# Patient Record
Sex: Male | Born: 1940 | Race: White | Hispanic: No | Smoking: Former smoker
Health system: Southern US, Community
[De-identification: ages and names within clinical notes are randomized; demographics above are authoritative.]

## PROBLEM LIST (undated history)

## (undated) DIAGNOSIS — C61 Malignant neoplasm of prostate: Secondary | ICD-10-CM

## (undated) DIAGNOSIS — T753XXA Motion sickness, initial encounter: Secondary | ICD-10-CM

## (undated) DIAGNOSIS — M1990 Unspecified osteoarthritis, unspecified site: Secondary | ICD-10-CM

## (undated) DIAGNOSIS — R972 Elevated prostate specific antigen [PSA]: Secondary | ICD-10-CM

## (undated) DIAGNOSIS — F419 Anxiety disorder, unspecified: Secondary | ICD-10-CM

## (undated) DIAGNOSIS — K219 Gastro-esophageal reflux disease without esophagitis: Secondary | ICD-10-CM

## (undated) DIAGNOSIS — C801 Malignant (primary) neoplasm, unspecified: Secondary | ICD-10-CM

## (undated) DIAGNOSIS — I10 Essential (primary) hypertension: Secondary | ICD-10-CM

## (undated) DIAGNOSIS — N419 Inflammatory disease of prostate, unspecified: Secondary | ICD-10-CM

## (undated) DIAGNOSIS — N400 Enlarged prostate without lower urinary tract symptoms: Secondary | ICD-10-CM

## (undated) DIAGNOSIS — M109 Gout, unspecified: Secondary | ICD-10-CM

## (undated) DIAGNOSIS — R351 Nocturia: Secondary | ICD-10-CM

## (undated) HISTORY — PX: APPENDECTOMY: SHX54

## (undated) HISTORY — DX: Nocturia: R35.1

## (undated) HISTORY — DX: Unspecified osteoarthritis, unspecified site: M19.90

## (undated) HISTORY — DX: Benign prostatic hyperplasia without lower urinary tract symptoms: N40.0

## (undated) HISTORY — DX: Elevated prostate specific antigen (PSA): R97.20

## (undated) HISTORY — PX: TONSILLECTOMY: SUR1361

## (undated) HISTORY — PX: SKIN CANCER EXCISION: SHX779

## (undated) HISTORY — DX: Inflammatory disease of prostate, unspecified: N41.9

## (undated) HISTORY — DX: Gout, unspecified: M10.9

---

## 2008-02-11 ENCOUNTER — Ambulatory Visit: Payer: Self-pay | Admitting: Gastroenterology

## 2012-05-20 LAB — PSA: PSA: 2.4

## 2014-05-04 DIAGNOSIS — L820 Inflamed seborrheic keratosis: Secondary | ICD-10-CM | POA: Diagnosis not present

## 2014-05-04 DIAGNOSIS — Z789 Other specified health status: Secondary | ICD-10-CM | POA: Diagnosis not present

## 2014-05-04 DIAGNOSIS — L821 Other seborrheic keratosis: Secondary | ICD-10-CM | POA: Diagnosis not present

## 2014-06-22 DIAGNOSIS — D485 Neoplasm of uncertain behavior of skin: Secondary | ICD-10-CM | POA: Diagnosis not present

## 2014-06-22 DIAGNOSIS — C44529 Squamous cell carcinoma of skin of other part of trunk: Secondary | ICD-10-CM | POA: Diagnosis not present

## 2014-07-15 DIAGNOSIS — C44529 Squamous cell carcinoma of skin of other part of trunk: Secondary | ICD-10-CM | POA: Diagnosis not present

## 2015-01-04 ENCOUNTER — Ambulatory Visit: Payer: Self-pay | Admitting: Urology

## 2015-01-04 ENCOUNTER — Encounter: Payer: Self-pay | Admitting: Obstetrics and Gynecology

## 2015-01-04 ENCOUNTER — Ambulatory Visit (INDEPENDENT_AMBULATORY_CARE_PROVIDER_SITE_OTHER): Payer: Medicare Other | Admitting: Obstetrics and Gynecology

## 2015-01-04 VITALS — BP 163/64 | HR 67 | Ht 70.0 in | Wt 192.0 lb

## 2015-01-04 DIAGNOSIS — N400 Enlarged prostate without lower urinary tract symptoms: Secondary | ICD-10-CM | POA: Diagnosis not present

## 2015-01-04 DIAGNOSIS — R972 Elevated prostate specific antigen [PSA]: Secondary | ICD-10-CM

## 2015-01-04 DIAGNOSIS — Z125 Encounter for screening for malignant neoplasm of prostate: Secondary | ICD-10-CM

## 2015-01-04 LAB — BLADDER SCAN AMB NON-IMAGING: SCAN RESULT: 0

## 2015-01-04 NOTE — Progress Notes (Signed)
01/04/2015 9:31 AM   [name] 01/26/1940 [id number]  Referring provider: No referring provider defined for this encounter.  Chief Complaint  Patient presents with  . Benign Prostatic Hypertrophy    1 year recheck  . Urinary Frequency    HPI: Patient is a 74 year old male with a history of BPH with LUTS and rising PSA presenting today for annual follow-up. His IPSS score today is 22 and quality of life is mostly dissatisfied. He reports symptoms of urinary frequency, and tendency, weak stream and occasional sensation of incomplete bladder emptying and having to strain to urinate. Nocturia approximately 3 times per night. Patient has tried Flomax in the past but discontinued due to symptoms of dizziness. He reports that his symptoms are unchanged since his last visit. He denies any flank pain, fevers or gross hematuria. No bone pain or unexplained weight loss.  PSA History: 12/31/13 PSA 3.4   12/13/11 PSA 2.3  PMH: Past Medical History  Diagnosis Date  . Prostatitis   . BPH (benign prostatic hyperplasia)   . Nocturia   . Arthritis   . Gout     Surgical History: Past Surgical History  Procedure Laterality Date  . Tonsillectomy    . Appendectomy      Home Medications:    Medication List       This list is accurate as of: 01/04/15  9:31 AM.  Always use your most recent med list.               aspirin 81 MG tablet  Take 81 mg by mouth daily.     CINNAMON PO  Take by mouth daily.     FISH OIL PO  Take by mouth daily.     GARLIC PO  Take by mouth daily.     multivitamin tablet  Take 1 tablet by mouth daily.        Allergies:  Allergies  Allergen Reactions  . Flomax [Tamsulosin Hcl] Other (See Comments)    Dizziness    Family History: Family History  Problem Relation Age of Onset  . Heart disease Father   . Stroke Mother   . Prostate cancer Neg Hx   . Kidney disease Father     one kidney removed    Social History:  reports that he  quit smoking about 43 years ago. He does not have any smokeless tobacco history on file. He reports that he drinks alcohol. He reports that he does not use illicit drugs.  ROS: UROLOGY Frequent Urination?: Yes Hard to postpone urination?: No Burning/pain with urination?: No Get up at night to urinate?: Yes Leakage of urine?: No Urine stream starts and stops?: Yes Trouble starting stream?: No Do you have to strain to urinate?: No Blood in urine?: No Urinary tract infection?: No Sexually transmitted disease?: No Injury to kidneys or bladder?: No Painful intercourse?: No Weak stream?: Yes Erection problems?: No Penile pain?: No  Gastrointestinal Nausea?: No Vomiting?: No Indigestion/heartburn?: Yes Diarrhea?: No Constipation?: No  Constitutional Fever: No Night sweats?: No Weight loss?: No Fatigue?: No  Skin Skin rash/lesions?: No Itching?: No  Eyes Blurred vision?: No Double vision?: No  Ears/Nose/Throat Sore throat?: No Sinus problems?: No  Hematologic/Lymphatic Swollen glands?: No Easy bruising?: No  Cardiovascular Leg swelling?: No Chest pain?: No  Respiratory Cough?: No Shortness of breath?: No  Endocrine Excessive thirst?: No  Musculoskeletal Back pain?: Yes Joint pain?: Yes  Neurological Headaches?: No Dizziness?: No  Psychologic Depression?: No Anxiety?: No  Physical  Exam: BP 163/64 mmHg  Pulse 67  Ht 5\' 10"  (1.778 m)  Wt 192 lb (87.091 kg)  BMI 27.55 kg/m2  Constitutional:  Alert and oriented, No acute distress. HEENT: [place] AT, moist mucus membranes.  Trachea midline, no masses. Cardiovascular: No clubbing, cyanosis, or edema. Respiratory: Normal respiratory effort, no increased work of breathing. GU: No CVA tenderness.  DRE: +2 smooth, no nodules Skin: No rashes, bruises or suspicious lesions. Neurologic: Grossly intact, no focal deficits, moving all 4 extremities. Psychiatric: Normal mood and affect.  Laboratory  Data:   Urinalysis Results for orders placed or performed in visit on 01/04/15  BLADDER SCAN AMB NON-IMAGING  Result Value Ref Range   Scan Result 0     Pertinent Imaging:   Assessment & Plan:   1. BPH (benign prostatic hyperplasia) with LUTS-   I-PSS:22,  QOL: 4.  Patient is reluctant to try any additional medications for his urinary tract symptoms due to previous intolerance of Flomax. His PVR is 0 today. I provided him with 1 week samples of Rapaflo to try and instructed him to take it before bed.   Will call for prescription if tolerated well. - PSA - BLADDER SCAN AMB NON-IMAGING  2. Rising PSA- PSA checked today. 12/31/13 PSA 3.4   12/13/11 PSA 2.3  3. Prostate Cancer Screening discussion- Recommendations: I discussed with the patient the implications of an elevated PSA. Specifically, I explained to the patient that in a man over the age of 41 with a mildly elevated PSA that his risk for advanced or aggressive prostate cancer was fairly low. Further, the risk of the patient dying from low-grade prostate cancer at this point in his life is also very low. We typically stop screening between the ages of 41 and 43 for this reason. At his age, the treatment for prostate cancer is palliative most often and is not initiated until the patient becomes symptomatic including worsening voiding symptoms or bone pain. Patient is a very healthy and active 74 year old male at this point we will plan to continue prostate cancer screening until at least age 69. Patient questions were answered and he is in agreement with plan. Plan: Check digital rectal exam annually and PSA  Follow-up when necessary  AUA Guideline's (2013): The panel does not recommend routine PSA screening in men age 79+ years or any man with less than a 10 to 30 year life expectancy.  If the individual is in excellent health and after discussion it is decided to do a screening PSA, the threshold for biopsy should be raised to 10  ng/mL and if the PSA returns below 3 ng/mL, discontinue screening.  Return in about 1 year (around 01/04/2016) for DRE/PSA.  These notes generated with voice recognition software. I apologize for typographical errors.  Herbert Moors, [facility] [address] [place], [place] [address] [phone]

## 2015-01-05 LAB — PSA: PROSTATE SPECIFIC AG, SERUM: 6.2 ng/mL — AB (ref 0.0–4.0)

## 2015-01-06 ENCOUNTER — Other Ambulatory Visit: Payer: Medicare Other

## 2015-01-06 ENCOUNTER — Other Ambulatory Visit: Payer: Self-pay | Admitting: Obstetrics and Gynecology

## 2015-01-06 ENCOUNTER — Telehealth: Payer: Self-pay

## 2015-01-06 DIAGNOSIS — N400 Enlarged prostate without lower urinary tract symptoms: Secondary | ICD-10-CM

## 2015-01-06 DIAGNOSIS — R972 Elevated prostate specific antigen [PSA]: Secondary | ICD-10-CM

## 2015-01-06 NOTE — Telephone Encounter (Signed)
-----   Message from Roda Shutters, [place] sent at 01/06/2015  8:40 AM EST ----- Patient notified of his PSA level. It has nearly doubled since last year. He is aware that they is very concerning for prostate cancer. He will return to have it rechecked for confirmation either today or tomorrow and I  recommended he prepare himself for a prostate biopsy. He understands that this is the only way to see if his PSA elevation is due to prostate cancer. I reviewed the risks of the prostate biopsy and explained the procedure to him. We'll you please mail him the prostate biopsy preparation paperwork as well as post procedure information.  He is taking 89 mg of aspirin daily which he will need to stop prior to his biopsy. I did not discuss this with him while I was speaking with him on the phone today. We need to inquire whether or not we need to have a primary care or etiology clearance for him to stop the aspirin prior to biopsy.  Please schedule a biopsy and notify the patient that should his rechecked PSA be inconsistent with his last we will cancel the biopsy. Otherwise we should proceed as planned. Thanks

## 2015-01-06 NOTE — Telephone Encounter (Signed)
Spoke with pt in reference to PSA results. Pt stated he started taking ASA on his own free will. Made pt aware will need to come off of ASA prior to prostate bx. Pt voiced understanding. Pt stated that he would like to wait until he gets PSA results before making appt.

## 2015-01-07 LAB — PSA: Prostate Specific Ag, Serum: 5.4 ng/mL — ABNORMAL HIGH (ref 0.0–4.0)

## 2015-01-13 ENCOUNTER — Telehealth: Payer: Self-pay | Admitting: Obstetrics and Gynecology

## 2015-01-13 NOTE — Telephone Encounter (Signed)

## 2015-01-25 ENCOUNTER — Other Ambulatory Visit: Payer: Medicare Other

## 2015-02-02 ENCOUNTER — Other Ambulatory Visit: Payer: Self-pay | Admitting: Urology

## 2015-02-02 ENCOUNTER — Ambulatory Visit (INDEPENDENT_AMBULATORY_CARE_PROVIDER_SITE_OTHER): Payer: Medicare Other | Admitting: Urology

## 2015-02-02 ENCOUNTER — Encounter: Payer: Self-pay | Admitting: Urology

## 2015-02-02 VITALS — BP 176/95 | HR 62 | Ht 70.0 in | Wt 193.0 lb

## 2015-02-02 DIAGNOSIS — R972 Elevated prostate specific antigen [PSA]: Secondary | ICD-10-CM | POA: Diagnosis not present

## 2015-02-02 DIAGNOSIS — C61 Malignant neoplasm of prostate: Secondary | ICD-10-CM | POA: Diagnosis not present

## 2015-02-02 HISTORY — DX: Elevated prostate specific antigen (PSA): R97.20

## 2015-02-02 MED ORDER — GENTAMICIN SULFATE 40 MG/ML IJ SOLN
80.0000 mg | Freq: Once | INTRAMUSCULAR | Status: AC
  Administered 2015-02-02: 80 mg via INTRAMUSCULAR

## 2015-02-02 MED ORDER — LEVOFLOXACIN 500 MG PO TABS
500.0000 mg | ORAL_TABLET | Freq: Once | ORAL | Status: AC
  Administered 2015-02-02: 500 mg via ORAL

## 2015-02-02 NOTE — Progress Notes (Signed)
Prostate Biopsy Procedure   Informed consent was obtained after discussing risks/benefits of the procedure.  A time out was performed to ensure correct patient identity.  Pre-Procedure: - Last PSA Level:  Lab Results  Component Value Date   PSA 2.4 05/20/2012   - Gentamicin given prophylactically - Levaquin 500 mg administered PO -Transrectal Ultrasound performed revealing a 55 gm prostate -No significant hypoechoic or median lobe noted  Procedure: - Prostate block performed using 10 cc 1% lidocaine and biopsies taken from sextant areas, a total of 12 under ultrasound guidance.  Post-Procedure: - Patient tolerated the procedure well - He was counseled to seek immediate medical attention if experiences any severe pain, significant bleeding, or fevers - Return in one week to discuss biopsy results

## 2015-02-06 LAB — PATHOLOGY REPORT

## 2015-02-09 ENCOUNTER — Ambulatory Visit (INDEPENDENT_AMBULATORY_CARE_PROVIDER_SITE_OTHER): Payer: Medicare Other | Admitting: Urology

## 2015-02-09 ENCOUNTER — Encounter: Payer: Self-pay | Admitting: Urology

## 2015-02-09 ENCOUNTER — Ambulatory Visit: Payer: Medicare Other

## 2015-02-09 VITALS — BP 147/77 | HR 62 | Ht 70.0 in | Wt 193.7 lb

## 2015-02-09 DIAGNOSIS — C61 Malignant neoplasm of prostate: Secondary | ICD-10-CM

## 2015-02-09 NOTE — Patient Instructions (Signed)
PSA in 5 months Prostate MRI in 5 months Repeat prostate biospy in 6 months

## 2015-02-09 NOTE — Progress Notes (Signed)
Tony Mendez is a 75 year old man who presents today for discussion of his new diagnosis of prostate cancer.  The patient was diagnosed with prostate cancer after a routine PSA screening on an annual exam revealed a PSA of 6.2. Repeat PSA returned 5.4. The patient does not have family history of prostate cancer. His rectal exam was normal.  I PSS: 20, Q O L3 Patient is able to obtain an erection, although currently is not sexually active.  PSA: 5.4 Prostate volume: 55 g Pathology: 7/12 cores positive for Gleason 3+3= 6, the patient had 4 cores positive on the left (95% in 1 core), he had 3 cores positive on the right, all were low volume.  Past Medical History  Diagnosis Date  . Prostatitis   . BPH (benign prostatic hyperplasia)   . Nocturia   . Arthritis   . Gout   . Elevated PSA 02/02/2015   Current Outpatient Prescriptions on File Prior to Visit  Medication Sig Dispense Refill  . aspirin 81 MG tablet Take 81 mg by mouth daily. Reported on 02/02/2015    . CINNAMON PO Take by mouth daily.    Marland Kitchen GARLIC PO Take by mouth daily.    . Multiple Vitamin (MULTIVITAMIN) tablet Take 1 tablet by mouth daily.    . Omega-3 Fatty Acids (FISH OIL PO) Take by mouth daily.     No current facility-administered medications on file prior to visit.    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 I explained to him the Gleason score in detail. I then explained to him the risk stratification, and informed him that he was considered low risk. We then went through the treatment options for which I explained to him active surveillance, robotic-assisted prostatectomy, and radiation therapy. After spending 30 minutes discussing the pros and cons of each treatment modality the patient has opted to proceed with active  surveillance.  As part of active surveillance, I explained to the patient that I typically get a prostate MRI 4-5 months after the initial biopsy and perform a confirmation biopsy. At that point, I would also like to obtain a PSA. We'll get this set up for the patient and I'll plan to see him back following his MRI.

## 2015-02-10 ENCOUNTER — Encounter: Payer: Self-pay | Admitting: Urology

## 2015-02-13 ENCOUNTER — Emergency Department
Admission: EM | Admit: 2015-02-13 | Discharge: 2015-02-13 | Disposition: A | Discharge status: Home / Self Care | Payer: Medicare Other | Attending: Emergency Medicine | Admitting: Emergency Medicine

## 2015-02-13 ENCOUNTER — Encounter: Payer: Self-pay | Admitting: Emergency Medicine

## 2015-02-13 ENCOUNTER — Emergency Department: Payer: Medicare Other

## 2015-02-13 DIAGNOSIS — Z7982 Long term (current) use of aspirin: Secondary | ICD-10-CM | POA: Insufficient documentation

## 2015-02-13 DIAGNOSIS — Z87891 Personal history of nicotine dependence: Secondary | ICD-10-CM | POA: Insufficient documentation

## 2015-02-13 DIAGNOSIS — R079 Chest pain, unspecified: Secondary | ICD-10-CM | POA: Diagnosis not present

## 2015-02-13 DIAGNOSIS — R0602 Shortness of breath: Secondary | ICD-10-CM | POA: Diagnosis not present

## 2015-02-13 DIAGNOSIS — Z79899 Other long term (current) drug therapy: Secondary | ICD-10-CM | POA: Insufficient documentation

## 2015-02-13 DIAGNOSIS — R0789 Other chest pain: Secondary | ICD-10-CM | POA: Diagnosis not present

## 2015-02-13 HISTORY — DX: Malignant neoplasm of prostate: C61

## 2015-02-13 HISTORY — DX: Malignant (primary) neoplasm, unspecified: C80.1

## 2015-02-13 LAB — COMPREHENSIVE METABOLIC PANEL
ALK PHOS: 58 U/L (ref 38–126)
ALT: 22 U/L (ref 17–63)
ANION GAP: 9 (ref 5–15)
AST: 27 U/L (ref 15–41)
Albumin: 4.2 g/dL (ref 3.5–5.0)
BUN: 11 mg/dL (ref 6–20)
CALCIUM: 9.3 mg/dL (ref 8.9–10.3)
CHLORIDE: 107 mmol/L (ref 101–111)
CO2: 26 mmol/L (ref 22–32)
Creatinine, Ser: 0.92 mg/dL (ref 0.61–1.24)
Glucose, Bld: 120 mg/dL — ABNORMAL HIGH (ref 65–99)
Potassium: 4.4 mmol/L (ref 3.5–5.1)
SODIUM: 142 mmol/L (ref 135–145)
Total Bilirubin: 1.4 mg/dL — ABNORMAL HIGH (ref 0.3–1.2)
Total Protein: 7.4 g/dL (ref 6.5–8.1)

## 2015-02-13 LAB — CBC
HCT: 47.6 % (ref 40.0–52.0)
Hemoglobin: 16.4 g/dL (ref 13.0–18.0)
MCH: 32.4 pg (ref 26.0–34.0)
MCHC: 34.4 g/dL (ref 32.0–36.0)
MCV: 94.3 fL (ref 80.0–100.0)
PLATELETS: 277 10*3/uL (ref 150–440)
RBC: 5.05 MIL/uL (ref 4.40–5.90)
RDW: 12.6 % (ref 11.5–14.5)
WBC: 8.1 10*3/uL (ref 3.8–10.6)

## 2015-02-13 LAB — TROPONIN I
Troponin I: <0.03 ng/mL (ref <0.031)
Troponin I: <0.03 ng/mL (ref <0.031)

## 2015-02-13 NOTE — ED Notes (Signed)
Chest pressure began yesterday, states increased while walking yesterday. Took aspirin this am.

## 2015-02-13 NOTE — ED Provider Notes (Signed)
[facility] Emergency Department Provider Note  Time seen: 10:14 AM  I have reviewed the triage vital signs and the nursing notes.   HISTORY  Chief Complaint Chest Pain    HPI Tony Mendez is a 75 y.o. male with a past medical history of gout, arthritis, recently diagnosed prostate cancer, who presents to the emergency department with chest pressure sensation. According to the patient since yesterday he has been experiencing a pressure sensation to his chest. Denies any "pain." States occasionally he feels short of breath but denies nausea or diaphoresis. Denies any cardiac history personally. States the shortness of breath is somewhat worse with exertion but denies any increased chest pressure with exertion.     Past Medical History  Diagnosis Date  . Prostatitis   . BPH (benign prostatic hyperplasia)   . Nocturia   . Arthritis   . Gout   . Elevated PSA 02/02/2015  . Cancer ([place])   . Prostate cancer [facility])     Patient Active Problem List   Diagnosis Date Noted  . Elevated PSA 02/02/2015    Past Surgical History  Procedure Laterality Date  . Tonsillectomy    . Appendectomy    . Skin cancer excision      Current Outpatient Rx  Name  Route  Sig  Dispense  Refill  . aspirin 81 MG tablet   Oral   Take 81 mg by mouth daily. Reported on 02/02/2015         . CINNAMON PO   Oral   Take by mouth daily.         Marland Kitchen GARLIC PO   Oral   Take by mouth daily.         . Multiple Vitamin (MULTIVITAMIN) tablet   Oral   Take 1 tablet by mouth daily.         . Omega-3 Fatty Acids (FISH OIL PO)   Oral   Take by mouth daily.           Allergies Flomax  Family History  Problem Relation Age of Onset  . Heart disease Father   . Stroke Mother   . Prostate cancer Neg Hx   . Kidney disease Father     one kidney removed    Social History Social History  Substance Use Topics  . Smoking status: Former Smoker    Quit date: 12/21/1971   . Smokeless tobacco: None  . Alcohol Use: 0.0 oz/week    0 Standard drinks or equivalent per week    Review of Systems Constitutional: Negative for fever. Cardiovascular: Chest pressure. Respiratory: Intermittent shortness of breath Gastrointestinal: Negative for abdominal pain Musculoskeletal: Negative for back pain. Neurological: Negative for headache 10-point ROS otherwise negative.  ____________________________________________   PHYSICAL EXAM:  VITAL SIGNS: ED Triage Vitals  Enc Vitals Group     BP 02/13/15 0942 179/114 mmHg     Pulse Rate 02/13/15 0942 66     Resp 02/13/15 0942 15     Temp 02/13/15 0942 98.7 F (37.1 C)     Temp Source 02/13/15 0942 Oral     SpO2 02/13/15 0942 99 %     Weight 02/13/15 0942 190 lb (86.183 kg)     Height 02/13/15 0942 5\' 10"  (1.778 m)     Head Cir --      Peak Flow --      Pain Score 02/13/15 0934 3     Pain Loc --  Pain Edu? --      Excl. in [place]? --     Constitutional: Alert and oriented. Well appearing and in no distress. Eyes: Normal exam ENT   Head: Normocephalic and atraumatic.   Mouth/Throat: Mucous membranes are moist. Cardiovascular: Normal rate, regular rhythm. No murmur Respiratory: Normal respiratory effort without tachypnea nor retractions. Breath sounds are clear and equal bilaterally. No wheezes/rales/rhonchi. Gastrointestinal: Soft and nontender. No distention.   Musculoskeletal: Nontender with normal range of motion in all extremities. No lower extremity tenderness or edema. Neurologic:  Normal speech and language. No gross focal neurologic deficits Skin:  Skin is warm, dry and intact.  Psychiatric: Mood and affect are normal.  ____________________________________________    EKG  EKG reviewed and interpreted by myself shows normal sinus rhythm at 68 bpm, narrow QRS, normal axis and normal intervals, no ST changes, occasional PVC.  ____________________________________________     RADIOLOGY  Chest x-ray shows no acute abnormality  ____________________________________________    INITIAL IMPRESSION / ASSESSMENT AND PLAN / ED COURSE  Pertinent labs & imaging results that were available during my care of the patient were reviewed by me and considered in my medical decision making (see chart for details).  Patient presents with mild chest pressure since yesterday. States he has been under a lot of stress since recently being diagnosed with prostate cancer several days ago. Denies any radiation of pain. States occasional shortness of breath but denies nausea or diaphoresis. We will check labs, chest x-ray. EKG is reassuring.  Chest x-ray negative. Labs are within normal limits. Troponin negative 2. We will discharge the patient home with primary care follow-up. If sent his primary care physician a message through Epic and help so we can arrange for a stress test this week.  ____________________________________________   FINAL CLINICAL IMPRESSION(S) / ED DIAGNOSES  Chest pain   Harvest Dark, MD 02/13/15 1308

## 2015-02-13 NOTE — Discharge Instructions (Signed)
You have been seen in the emergency department today for chest pain. Your workup has shown normal results. As we discussed please follow-up with your primary care physician in the next 1-2 days for recheck and to arrange a stress test. Return to the emergency department for any further chest pain, trouble breathing, or any other symptom personally concerning to yourself.   Nonspecific Chest Pain It is often hard to find the cause of chest pain. There is always a chance that your pain could be related to something serious, such as a heart attack or a blood clot in your lungs. Chest pain can also be caused by conditions that are not life-threatening. If you have chest pain, it is very important to follow up with your doctor.  HOME CARE  If you were prescribed an antibiotic medicine, finish it all even if you start to feel better.  Avoid any activities that cause chest pain.  Do not use any tobacco products, including cigarettes, chewing tobacco, or electronic cigarettes. If you need help quitting, ask your doctor.  Do not drink alcohol.  Take medicines only as told by your doctor.  Keep all follow-up visits as told by your doctor. This is important. This includes any further testing if your chest pain does not go away.  Your doctor may tell you to keep your head raised (elevated) while you sleep.  Make lifestyle changes as told by your doctor. These may include:  Getting regular exercise. Ask your doctor to suggest some activities that are safe for you.  Eating a heart-healthy diet. Your doctor or a diet specialist (dietitian) can help you to learn healthy eating options.  Maintaining a healthy weight.  Managing diabetes, if necessary.  Reducing stress. GET HELP IF:  Your chest pain does not go away, even after treatment.  You have a rash with blisters on your chest.  You have a fever. GET HELP RIGHT AWAY IF:  Your chest pain is worse.  You have an increasing cough, or you  cough up blood.  You have severe belly (abdominal) pain.  You feel extremely weak.  You pass out (faint).  You have chills.  You have sudden, unexplained chest discomfort.  You have sudden, unexplained discomfort in your arms, back, neck, or jaw.  You have shortness of breath at any time.  You suddenly start to sweat, or your skin gets clammy.  You feel nauseous.  You vomit.  You suddenly feel light-headed or dizzy.  Your heart begins to beat quickly, or it feels like it is skipping beats. These symptoms may be an emergency. Do not wait to see if the symptoms will go away. Get medical help right away. Call your local emergency services (911 in the [place] Do not drive yourself to the hospital.   This information is not intended to replace advice given to you by your health care provider. Make sure you discuss any questions you have with your health care provider.   Document Released: 06/21/2007 Document Revised: 01/23/2014 Document Reviewed: 08/08/2013 Elsevier Interactive Patient Education [employer].

## 2015-02-16 DIAGNOSIS — R5381 Other malaise: Secondary | ICD-10-CM | POA: Diagnosis not present

## 2015-02-16 DIAGNOSIS — I10 Essential (primary) hypertension: Secondary | ICD-10-CM | POA: Diagnosis not present

## 2015-02-16 DIAGNOSIS — E784 Other hyperlipidemia: Secondary | ICD-10-CM | POA: Diagnosis not present

## 2015-02-23 DIAGNOSIS — Z0000 Encounter for general adult medical examination without abnormal findings: Secondary | ICD-10-CM | POA: Diagnosis not present

## 2015-02-25 DIAGNOSIS — R079 Chest pain, unspecified: Secondary | ICD-10-CM | POA: Diagnosis not present

## 2015-02-25 DIAGNOSIS — I10 Essential (primary) hypertension: Secondary | ICD-10-CM | POA: Diagnosis not present

## 2015-02-25 DIAGNOSIS — E785 Hyperlipidemia, unspecified: Secondary | ICD-10-CM | POA: Diagnosis not present

## 2015-03-11 DIAGNOSIS — R079 Chest pain, unspecified: Secondary | ICD-10-CM | POA: Diagnosis not present

## 2015-03-11 DIAGNOSIS — I10 Essential (primary) hypertension: Secondary | ICD-10-CM | POA: Diagnosis not present

## 2015-03-11 DIAGNOSIS — E785 Hyperlipidemia, unspecified: Secondary | ICD-10-CM | POA: Diagnosis not present

## 2015-04-08 ENCOUNTER — Ambulatory Visit (INDEPENDENT_AMBULATORY_CARE_PROVIDER_SITE_OTHER): Payer: Medicare Other | Admitting: Urology

## 2015-04-08 ENCOUNTER — Encounter: Payer: Self-pay | Admitting: Urology

## 2015-04-08 VITALS — BP 151/80 | HR 62 | Ht 71.0 in | Wt 193.0 lb

## 2015-04-08 DIAGNOSIS — C61 Malignant neoplasm of prostate: Secondary | ICD-10-CM | POA: Diagnosis not present

## 2015-04-08 DIAGNOSIS — N400 Enlarged prostate without lower urinary tract symptoms: Secondary | ICD-10-CM

## 2015-04-08 NOTE — Progress Notes (Signed)
04/08/2015 2:54 PM   [name] 12-07-1940 [id number]  Referring provider: Cletis Athens, MD [address] [place], [place] [address]  Chief Complaint  Patient presents with  . Prostate Cancer    second opinion    HPI: 75 year old male recently diagnosed with Gleason 3+3 prostate cancer status post prostate biopsy on 02/02/2015. Biopsy was performed for rising PSA as high 6.2/5.4 around the time of biopsy. He was noted to have a 55 g prostate. Pathology showed 712 cores positive for Gleason 3+3, 4 and the left involving up to 95% of the tissue, and 3 on the right which are low volume disease.  He does have baseline urinary symptoms.  Pre-diagnosis IPSS 20/3.  Nares symptoms primarily include urinary frequency, weak stream, occasional sensation of incomplete bladder emptying and nocturia 3. He is previously tried Flomax which caused dizziness.  No baseline erectile dysfunction however is not sexually active and not worried about the quality of his erections.  No weight loss or bone pain.  No family history of prostate cancer.  He was seen following the biopsy by my colleague Dr. Louis Meckel at which time he elected to undergo active surveillance with plans for prostatic MRI at 6 months after diagnosis, serial PSA/DRE. He returns today for a second opinion. He is somewhat anxious about not intervening on his prostate cancer and worried about his rising PSA. He requests a PSA today.   Component     Latest Ref Rng 05/20/2012 01/04/2015 01/06/2015  PSA     0.0 - 4.0 ng/mL 2.4 6.2 (H) 5.4 (H)     PMH: Past Medical History  Diagnosis Date  . Prostatitis   . BPH (benign prostatic hyperplasia)   . Nocturia   . Arthritis   . Gout   . Elevated PSA 02/02/2015  . Cancer ([place])   . Prostate cancer [facility])     Surgical History: Past Surgical History  Procedure Laterality Date  . Tonsillectomy    . Appendectomy    . Skin cancer excision      Home Medications:      Medication List       This list is accurate as of: 04/08/15  2:54 PM.  Always use your most recent med list.               aspirin 81 MG tablet  Take 81 mg by mouth daily. Reported on 02/02/2015     CINNAMON PO  Take by mouth daily.     FISH OIL PO  Take by mouth daily.     GARLIC PO  Take by mouth daily.     multivitamin tablet  Take 1 tablet by mouth daily.        Allergies:  Allergies  Allergen Reactions  . Flomax [Tamsulosin Hcl] Other (See Comments)    Dizziness    Family History: Family History  Problem Relation Age of Onset  . Heart disease Father   . Stroke Mother   . Prostate cancer Neg Hx   . Kidney disease Father     one kidney removed    Social History:  reports that he quit smoking about 43 years ago. He does not have any smokeless tobacco history on file. He reports that he drinks alcohol. He reports that he does not use illicit drugs.  ROS: UROLOGY Frequent Urination?: Yes Hard to postpone urination?: No Burning/pain with urination?: No Get up at night to urinate?: Yes Leakage of urine?: No Urine stream starts and stops?:  Yes Trouble starting stream?: No Do you have to strain to urinate?: No Blood in urine?: No Urinary tract infection?: No Sexually transmitted disease?: No Injury to kidneys or bladder?: No Painful intercourse?: No Weak stream?: No Erection problems?: No Penile pain?: No  Gastrointestinal Nausea?: No Vomiting?: No Indigestion/heartburn?: No Diarrhea?: No Constipation?: No  Constitutional Fever: No Night sweats?: No Weight loss?: No Fatigue?: No  Skin Skin rash/lesions?: No Itching?: No  Eyes Blurred vision?: No Double vision?: No  Ears/Nose/Throat Sore throat?: No Sinus problems?: No  Hematologic/Lymphatic Swollen glands?: No Easy bruising?: No  Cardiovascular Leg swelling?: No Chest pain?: No  Respiratory Shortness of breath?: No  Endocrine Excessive thirst?:  No  Musculoskeletal Back pain?: Yes Joint pain?: Yes  Neurological Headaches?: No Dizziness?: No  Psychologic Depression?: No Anxiety?: No  Physical Exam: BP 151/80 mmHg  Pulse 62  Ht 5\' 11"  (1.803 m)  Wt 193 lb (87.544 kg)  BMI 26.93 kg/m2  Constitutional:  Alert and oriented, No acute distress.  Slightly younger than stated age in appearance. HEENT: [place] AT, moist mucus membranes.  Trachea midline, no masses. Cardiovascular: No clubbing, cyanosis, or edema. Respiratory: Normal respiratory effort, no increased work of breathing. GI: Thin.  Skin: No rashes, bruises or suspicious lesions. Neurologic: Grossly intact, no focal deficits, moving all 4 extremities. Psychiatric: Normal mood and affect.  Laboratory Data: Lab Results  Component Value Date   WBC 8.1 02/13/2015   HGB 16.4 02/13/2015   HCT 47.6 02/13/2015   MCV 94.3 02/13/2015   PLT 277 02/13/2015    Lab Results  Component Value Date   CREATININE 0.92 02/13/2015    Lab Results  Component Value Date   PSA 2.4 05/20/2012    Pertinent Imaging: n/a  Assessment & Plan:    1. Prostate cancer [facility]) The patient was counseled about the natural history of prostate cancer and the standard treatment options that are available for prostate cancer. It was explained to him how his age and life expectancy, clinical stage, Gleason score, and PSA affect his prognosis, the decision to proceed with additional staging studies, as well as how that information influences recommended treatment strategies. We discussed the roles for active surveillance, radiation therapy including external beam radiation and brachytherapy, surgical therapy, androgen deprivation, as well as ablative therapy options for the treatment of prostate cancer as appropriate to his individual cancer situation. We discussed the risks and benefits of these options with regard to their impact on cancer control and also in terms of potential adverse events,  complications, and impact on quality of life particularly related to urinary, bowel, and sexual function. The patient was encouraged to ask questions throughout the discussion today and all questions were answered to his stated satisfaction. In addition, the patient was providedwith and/or directed to appropriate resources and literature for further education about prostate cancer treatment options.  Based on his overall risk stratification, he falls into the low risk category although does have a fairly significant volume of disease and a decent rise in his PSA over the past few years. Active surveillance is a reasonable option although we discussed today that often patient's crossover into other treatment arms for various reasons including patient anxiety, rising PSA, significant lesions, or repeat biopsy results with evidence of progressive disease.  He was offered a referral today to radiation oncology to further explore his options which he declines. Although he is 70, he is fairly healthy and has an excellent functional status and would offer him robotic prostatectomy  with the understanding that his risk of erectile dysfunction and incontinence are higher than a younger patient.  At this point, he will continue on his active surveillance protocol. He would like to have a repeat PSA in one month at which time we will call him with these results.  Plan for endorectal MRI in July as Artie planned. He will follow-up shortly thereafter.  - PSA; Future  2. BPH (benign prostatic hyperplasia) Baseline lower urinary urinary tract symptoms. Unable to tolerate Flomax. Not interested in any further medical management this time. We'll continue to follow.   Return for 1 month for PSA (lab only) then f/u after MRI July.  Hollice Espy, MD  [facility] [address] [place], [place] [address] [phone]  I spent 25 min with this patient of which greater than 50%  was spent in counseling and coordination of care with the patient.

## 2015-05-10 ENCOUNTER — Other Ambulatory Visit: Payer: Medicare Other

## 2015-05-10 ENCOUNTER — Other Ambulatory Visit: Payer: Self-pay

## 2015-05-10 DIAGNOSIS — C61 Malignant neoplasm of prostate: Secondary | ICD-10-CM

## 2015-05-11 LAB — PSA: Prostate Specific Ag, Serum: 5.1 ng/mL — ABNORMAL HIGH (ref 0.0–4.0)

## 2015-05-13 ENCOUNTER — Telehealth: Payer: Self-pay

## 2015-05-13 NOTE — Telephone Encounter (Signed)
-----   Message from Hollice Espy, MD sent at 05/11/2015  4:25 PM EDT ----- PSA stable, slightly lower than 4 months ago at 5.4  Plan for MRI as discussed.    Hollice Espy, MD

## 2015-05-13 NOTE — Telephone Encounter (Signed)
Spoke with pt in reference to PSA results and MRI. Pt voiced understanding.

## 2015-05-17 ENCOUNTER — Telehealth: Payer: Self-pay

## 2015-05-17 NOTE — Telephone Encounter (Signed)
-----   Message from Ardis Hughs, MD sent at 05/15/2015 11:46 AM EDT ----- Regarding: psa Please inform patient of his current PSA and that it's stable. ----- Message -----    From: SYSTEM    Sent: 05/15/2015  12:04 AM      To: Ardis Hughs, MD

## 2015-05-17 NOTE — Telephone Encounter (Signed)
LMOM-most recent labs are normal.  

## 2015-06-28 ENCOUNTER — Telehealth: Payer: Self-pay | Admitting: Urology

## 2015-06-28 NOTE — Telephone Encounter (Signed)
Patient called because he received a call to schedule his MRI but the patient did not want to schedule because he said he is claustrophobic and will not be able to stay in the machine for the 45 minutes it will take to perform to exam. He said the only way he would be able to do it would be if he was asleep or medicated? He wants to know if he even needs to really have the MRI and wants you to call him to discuss this. His biopsy is scheduled for 08-03-15 and he needs to MRI prior to this.  He said he doesn't even know if being medicated would help him? He is very claustrophobic and can't be in closed up spaces at all. Don't we have an open MRI somewhere? Please call him to discuss and let me know so that we can get this scheduled. They are booking out at least 2-3 weeks for MRI's.   Thanks,  Sharyn Lull

## 2015-07-01 NOTE — Telephone Encounter (Signed)
There is an open MRI in [place].  We can prescribe him Valium to help.

## 2015-07-02 ENCOUNTER — Telehealth: Payer: Self-pay | Admitting: Urology

## 2015-07-02 MED ORDER — DIAZEPAM 10 MG PO TABS: 10.0000 mg | ORAL_TABLET | Freq: Four times a day (QID) | ORAL | Status: DC | PRN

## 2015-07-02 NOTE — Telephone Encounter (Signed)
done

## 2015-07-02 NOTE — Telephone Encounter (Signed)
Patient called me last week and said that he was not able to have his MRI at [facility] because of his claustrophobia. i sent Dr. Louis Meckel a message and he text me back and asked if you would write him a script for Valium and he is going to have his MRI done in [place].  Can you do this for me please and I will have the patient come and pick it up.   thanks

## 2015-07-07 ENCOUNTER — Ambulatory Visit (HOSPITAL_COMMUNITY)
Admission: RE | Admit: 2015-07-07 | Discharge: 2015-07-07 | Disposition: A | Discharge status: Home / Self Care | Payer: Medicare Other | Source: Ambulatory Visit | Attending: Urology | Admitting: Urology

## 2015-07-07 DIAGNOSIS — C61 Malignant neoplasm of prostate: Secondary | ICD-10-CM | POA: Diagnosis not present

## 2015-07-07 LAB — CREATININE, SERUM
Creatinine, Ser: 0.96 mg/dL (ref 0.61–1.24)
GFR calc non Af Amer: >60 mL/min (ref >60)

## 2015-07-07 MED ORDER — GADOBENATE DIMEGLUMINE 529 MG/ML IV SOLN
20.0000 mL | Freq: Once | INTRAVENOUS | Status: AC
  Administered 2015-07-07: 18 mL via INTRAVENOUS

## 2015-07-26 ENCOUNTER — Other Ambulatory Visit: Payer: Self-pay

## 2015-07-26 DIAGNOSIS — Z8546 Personal history of malignant neoplasm of prostate: Secondary | ICD-10-CM

## 2015-07-27 ENCOUNTER — Telehealth: Payer: Self-pay | Admitting: Urology

## 2015-07-27 ENCOUNTER — Other Ambulatory Visit: Payer: Medicare Other

## 2015-07-27 DIAGNOSIS — Z8546 Personal history of malignant neoplasm of prostate: Secondary | ICD-10-CM | POA: Diagnosis not present

## 2015-07-27 NOTE — Telephone Encounter (Signed)
Patient was in the office today and requested a valium prior to his biopsy on 7/18.  He said that he is very anxious and nervous about the procedure and he had a valium with his recent MRI and it helped.  Please advise.

## 2015-07-28 LAB — PSA: Prostate Specific Ag, Serum: 5.2 ng/mL — ABNORMAL HIGH (ref 0.0–4.0)

## 2015-08-03 ENCOUNTER — Other Ambulatory Visit: Payer: Medicare Other

## 2015-08-06 ENCOUNTER — Encounter: Payer: Self-pay | Admitting: Urology

## 2015-08-06 ENCOUNTER — Ambulatory Visit (INDEPENDENT_AMBULATORY_CARE_PROVIDER_SITE_OTHER): Payer: Medicare Other | Admitting: Urology

## 2015-08-06 VITALS — BP 165/81 | HR 80 | Ht 70.0 in | Wt 193.0 lb

## 2015-08-06 DIAGNOSIS — C61 Malignant neoplasm of prostate: Secondary | ICD-10-CM | POA: Diagnosis not present

## 2015-08-06 DIAGNOSIS — N400 Enlarged prostate without lower urinary tract symptoms: Secondary | ICD-10-CM

## 2015-08-06 NOTE — Progress Notes (Signed)
8:52 AM  08/09/15   [name] [birth year] [id number]  Referring provider: Cletis Athens, MD [address] [place], [place] [address]  Chief Complaint  Patient presents with  . Prostate Cancer    MRI results    HPI: 75 year old male recently diagnosed with Gleason 3+3 prostate cancer status post prostate biopsy on 02/02/2015. Biopsy was performed for rising PSA as high 6.2/5.4 around the time of biopsy. He was noted to have a 55 g prostate. Pathology showed 7/12 cores positive for Gleason 3+3, 4 and the left involving up to 95% of the tissue, and 3 on the right which are low volume disease.  He returns today after undergoing a prostate MRI on 07/07/2015 for further surveillance.  This shows an area at the right base/mid gland which measures 1.7 cm which is concerning for high-grade carcinoma. There is no evidence of local invasion, metastatic disease, or pelvic adenopathy.  His PSA has remained relatively stable since his diagnosis.  He does have baseline urinary symptoms.  Pre-diagnosis IPSS 20/3.  Nares symptoms primarily include urinary frequency, weak stream, occasional sensation of incomplete bladder emptying and nocturia 3. He is previously tried Flomax which caused dizziness.  No baseline erectile dysfunction however is not sexually active and not worried about the quality of his erections.  No weight loss or bone pain.  No family history of prostate cancer.    Component     Latest Ref Rng 05/20/2012 01/04/2015 01/06/2015 05/10/2015 07/27/2015  PSA     0.0 - 4.0 ng/mL 2.4 6.2 (H) 5.4 (H) 5.1 (H) 5.2 (H)    PMH: Past Medical History:  Diagnosis Date  . Arthritis   . BPH (benign prostatic hyperplasia)   . Cancer ([place])   . Elevated PSA 02/02/2015  . Gout   . Nocturia   . Prostate cancer ([place])   . Prostatitis     Surgical History: Past Surgical History:  Procedure Laterality Date  . APPENDECTOMY    . SKIN CANCER EXCISION    . TONSILLECTOMY      Home  Medications:    Medication List       Accurate as of 08/06/15 11:59 PM. Always use your most recent med list.          aspirin 81 MG tablet Take 81 mg by mouth daily. Reported on 02/02/2015   CINNAMON PO Take by mouth daily.   FISH OIL PO Take by mouth daily.   GARLIC PO Take by mouth daily.   multivitamin tablet Take 1 tablet by mouth daily.       Allergies:  Allergies  Allergen Reactions  . Flomax [Tamsulosin Hcl] Other (See Comments)    Dizziness    Family History: Family History  Problem Relation Age of Onset  . Heart disease Father   . Stroke Mother   . Prostate cancer Neg Hx   . Kidney disease Father     one kidney removed    Social History:  reports that he quit smoking about 43 years ago. He does not have any smokeless tobacco history on file. He reports that he drinks alcohol. He reports that he does not use drugs.  ROS: UROLOGY Frequent Urination?: Yes Hard to postpone urination?: No Burning/pain with urination?: No Get up at night to urinate?: Yes Leakage of urine?: No Urine stream starts and stops?: Yes Trouble starting stream?: No Do you have to strain to urinate?: No Blood in urine?: No Urinary tract infection?: No Sexually transmitted disease?: No  Injury to kidneys or bladder?: No Painful intercourse?: No Weak stream?: Yes Erection problems?: No Penile pain?: No  Gastrointestinal Nausea?: No Vomiting?: No Indigestion/heartburn?: No Diarrhea?: No Constipation?: No  Constitutional Fever: No Night sweats?: No Weight loss?: No Fatigue?: No  Skin Skin rash/lesions?: No Itching?: No  Eyes Blurred vision?: No Double vision?: No  Ears/Nose/Throat Sore throat?: No Sinus problems?: No  Hematologic/Lymphatic Swollen glands?: No Easy bruising?: No  Cardiovascular Leg swelling?: No Chest pain?: No  Respiratory Cough?: No Shortness of breath?: No  Endocrine Excessive thirst?: No  Musculoskeletal Back pain?:  No Joint pain?: No  Neurological Headaches?: No Dizziness?: No  Psychologic Depression?: No Anxiety?: No  Physical Exam: BP (!) 165/81   Pulse 80   Ht 5\' 10"  (1.778 m)   Wt 193 lb (87.5 kg)   BMI 27.69 kg/m    Constitutional:  Alert and oriented, No acute distress.  Slightly younger than stated age in appearance. HEENT: [place] AT, moist mucus membranes.  Trachea midline, no masses. Cardiovascular: No clubbing, cyanosis, or edema. Respiratory: Normal respiratory effort, no increased work of breathing. GI: Thin. No tender, non distended.   Skin: No rashes, bruises or suspicious lesions. Neurologic: Grossly intact, no focal deficits, moving all 4 extremities. Psychiatric: Normal mood and affect.  Laboratory Data: Lab Results  Component Value Date   WBC 8.1 02/13/2015   HGB 16.4 02/13/2015   HCT 47.6 02/13/2015   MCV 94.3 02/13/2015   PLT 277 02/13/2015    Lab Results  Component Value Date   CREATININE 0.96 07/07/2015    PSA trend as above  Pertinent Imaging: CLINICAL DATA:  Biopsy-proven prostate carcinoma.  Elevated PSA.  EXAM: MR PROSTATE WITHOUT AND WITH CONTRAST  TECHNIQUE: Multiplanar multisequence MRI images were obtained of the pelvis centered about the prostate. Pre and post contrast images were obtained.  CONTRAST:  32mL MULTIHANCE GADOBENATE DIMEGLUMINE 529 MG/ML IV SOLN  COMPARISON:  None.  FINDINGS: Prostate: There is loss of signal intensity within the peripheral zone on T2 weighted imaging within the RIGHT base, RIGHT mid gland, and RIGHT lateral mid gland. Focus of loss of signal intensity within the LEFT base additionally.  There is a focus of restricted diffusion in the RIGHT base/mid gland measuring 1.7 x 1.5 cm (image 10, series 8 ). This corresponds to the signal abnormality on the T2 weighted imaging.  There is a smaller focus of restricted diffusion in the LEFT base on image 10. Less intense restricted diffusion at the  midline LEFT and RIGHT mid gland on image 8 series 850.  Transitional zone is nodular and minimally enlarged with no clear abnormal nodules in the transitional zone.  The prostatic capsule appears intact. The recto prostatic angles are sharp. The neurovascular bundles appear intact.  There is blood product within the RIGHT seminal vesicle as seen on the T1 weighted series 6.  No pelvic lymphadenopathy.  No bone metastasis.  Transcapsular spread:  Absent  Seminal vesicle involvement: Absent  Neurovascular bundle involvement: Absent  Pelvic adenopathy: Absent  Bone metastasis: absent  Other findings: Prostate gland measures 5.4 by 4.3. By 4.4 cm. Sigmoid diverticulosis. Normal bladder  IMPRESSION: 1. High-grade carcinoma measuring 1.7 cm at the RIGHT base / mid gland (BI-RADS 5). 2. Additional foci of carcinoma within the RIGHT LEFT mid gland. 3. No transscapular spread or neurovascular involvement. 4. Blood within the RIGHT seminal vesicle presumably relates to biopsy. 5. No adenopathy.   Electronically Signed   By: Helane Gunther.D.  On: 07/07/2015 16:42  MRI reviewed personally today and with the patient.  Assessment & Plan:    1. Prostate cancer [facility]) 75 year old male initially diagnosed with Gleason 3+3 involving 7 of 12 cores (high volume) in 1/17 with relative stable PSA since diagnosis. Unfortunately, however, MRI shows an area which is concerning for high-grade carcinoma at the right base/mid gland. These results are reviewed today in detail. We discussed his options moving forward including repeat biopsy with either cognitive or MRI fusion technology to ensure that this area is biopsied. Alternatively, he could consider treatment for presumed high-grade prostate cancer in the setting of this MRI finding. Based on the MRI results, I believe it is no longer a candidate for active surveillance.  His options were reviewed again today in detail.  Generally, radical prostatectomy is reserved for those 78 and younger although he is extremely healthy and has few comorbidities. As such, I would offer him robotic prostatectomy. We've discussed the preoperative, intraoperative, and postoperative courses. I did explain that given his age, he is extremely high risk for urinary incontinence and erectile dysfunction postoperatively.    Alternatively, he could consider external beam or brachytherapy. Given the concern for possible high risk disease, I will defer this discussion to Dr. Berton Mount of radiation oncology as to what his preference would be in treating this tumor.  We briefly discussed additional alternatives including HIFU, cryo, amongst others. All of his questions were answered.  Plan to refer patient to radiation oncology for further discussion. He will return back to our office if he likes to proceed with surgery.   Orders Placed This Encounter  Procedures  . Ambulatory referral to Radiation Oncology    Referral Priority:   Routine    Referral Type:   Consultation    Referral Reason:   Specialty Services Required    Requested Specialty:   Radiation Oncology    Number of Visits Requested:   1    2. BPH (benign prostatic hyperplasia) Baseline lower urinary urinary tract symptoms. Unable to tolerate Flomax. Not interested in any further medical management this time. We'll continue to follow.   Return in about 3 weeks (around 08/27/2015).  Hollice Espy, MD  [place] [address], [place], [place] [address] [phone]  I spent 25 min with this patient of which greater than 50% was spent in counseling and coordination of care with the patient.

## 2015-08-10 ENCOUNTER — Ambulatory Visit: Payer: Medicare Other

## 2015-08-18 ENCOUNTER — Encounter: Payer: Self-pay | Admitting: Radiation Oncology

## 2015-08-18 ENCOUNTER — Ambulatory Visit
Admission: RE | Admit: 2015-08-18 | Discharge: 2015-08-18 | Disposition: A | Discharge status: Home / Self Care | Payer: Medicare Other | Source: Ambulatory Visit | Attending: Radiation Oncology | Admitting: Radiation Oncology

## 2015-08-18 VITALS — BP 157/83 | HR 102 | Temp 98.5°F | Resp 20 | Wt 191.6 lb

## 2015-08-18 DIAGNOSIS — C61 Malignant neoplasm of prostate: Secondary | ICD-10-CM | POA: Insufficient documentation

## 2015-08-18 NOTE — Progress Notes (Signed)
Except an outstanding is perfect of Radiation Oncology NEW PATIENT EVALUATION  Name: Tony Mendez  MRN: [id number]  Date:   08/18/2015     DOB: 12/07/1940   This 75 y.o. male patient presents to the clinic for initial evaluation of stage I (T1 CN 0 M0) Gleason 6 (3+3) presenting with a PSA of 6.2.  REFERRING PHYSICIAN: Cletis Athens, MD  CHIEF COMPLAINT:  Chief Complaint  Patient presents with  . Prostate Cancer    Pt is here for initial consultation of  prostate cancer.      DIAGNOSIS: The encounter diagnosis was Malignant neoplasm of prostate ([place]).   PREVIOUS INVESTIGATIONS:  Pathology reports reviewed MRI scan reviewed Clinical notes reviewed  HPI: Patient is a 74 year old male in excellent general condition who is been followed for a rising PSA over the past year. Initially went up to 6.2 in December 2016 and was found to have adenocarcinoma the prostate Gleason 6 (3+3) involving 7 of 12 core biopsies. This is mostly on the right side of the gland. He underwent MRI scan on 07/07/2015 showing an area in the right base measuring 1.7 cm concerning for high-grade carcinoma. No evidence of local invasion metastatic disease or pelvic adenopathy was seen. His PSA has however above 5 last recording was 5.2 on 07/27/2015. He specifically denies erectile dysfunction. Treatment options have been reviewed with urology he is seen today for radiation oncology opinion. He does have some urgency and frequency with nocturia 3. His gland has been measuring approximately 55 cc.  PLANNED TREATMENT REGIMEN: I-125 interstitial implant  PAST MEDICAL HISTORY:  has a past medical history of Arthritis; BPH (benign prostatic hyperplasia); Cancer ([place]); Elevated PSA (02/02/2015); Gout; Nocturia; Prostate cancer ([place]); and Prostatitis.    PAST SURGICAL HISTORY:  Past Surgical History:  Procedure Laterality Date  . APPENDECTOMY    . SKIN CANCER EXCISION    . TONSILLECTOMY      FAMILY HISTORY:  family history includes Heart disease in his father; Kidney disease in his father; Stroke in his mother.  SOCIAL HISTORY:  reports that he quit smoking about 43 years ago. He does not have any smokeless tobacco history on file. He reports that he drinks alcohol. He reports that he does not use drugs.  ALLERGIES: Flomax [tamsulosin hcl]  MEDICATIONS:  Current Outpatient Prescriptions  Medication Sig Dispense Refill  . aspirin 81 MG tablet Take 81 mg by mouth daily. Reported on 02/02/2015    . CINNAMON PO Take by mouth daily.    Marland Kitchen GARLIC PO Take by mouth daily.    . Multiple Vitamin (MULTIVITAMIN) tablet Take 1 tablet by mouth daily.    . Omega-3 Fatty Acids (FISH OIL PO) Take by mouth daily.     No current facility-administered medications for this encounter.     ECOG PERFORMANCE STATUS:  0 - Asymptomatic  REVIEW OF SYSTEMS:  Patient denies any weight loss, fatigue, weakness, fever, chills or night sweats. Patient denies any loss of vision, blurred vision. Patient denies any ringing  of the ears or hearing loss. No irregular heartbeat. Patient denies heart murmur or history of fainting. Patient denies any chest pain or pain radiating to her upper extremities. Patient denies any shortness of breath, difficulty breathing at night, cough or hemoptysis. Patient denies any swelling in the lower legs. Patient denies any nausea vomiting, vomiting of blood, or coffee ground material in the vomitus. Patient denies any stomach pain. Patient states has had normal bowel movements no significant constipation or  diarrhea. Patient denies any dysuria, hematuria or significant nocturia. Patient denies any problems walking, swelling in the joints or loss of balance. Patient denies any skin changes, loss of hair or loss of weight. Patient denies any excessive worrying or anxiety or significant depression. Patient denies any problems with insomnia. Patient denies excessive thirst, polyuria, polydipsia. Patient denies  any swollen glands, patient denies easy bruising or easy bleeding. Patient denies any recent infections, allergies or URI. Patient "s visual fields have not changed significantly in recent time.    PHYSICAL EXAM: BP (!) 157/83   Pulse (!) 102   Temp 98.5 F (36.9 C)   Resp 20   Wt 191 lb 9.3 oz (86.9 kg)   BMI 27.49 kg/m  On rectal exam rectal sphincter tone is good. Prostate is smooth contracted without evidence of nodularity or mass. Sulcus is preserved bilaterally. No discrete nodularity is identified. No other rectal abnormalities are noted. Well-developed well-nourished patient in NAD. HEENT reveals PERLA, EOMI, discs not visualized.  Oral cavity is clear. No oral mucosal lesions are identified. Neck is clear without evidence of cervical or supraclavicular adenopathy. Lungs are clear to A&P. Cardiac examination is essentially unremarkable with regular rate and rhythm without murmur rub or thrill. Abdomen is benign with no organomegaly or masses noted. Motor sensory and DTR levels are equal and symmetric in the upper and lower extremities. Cranial nerves II through XII are grossly intact. Proprioception is intact. No peripheral adenopathy or edema is identified. No motor or sensory levels are noted. Crude visual fields are within normal range.  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 I have suggested that based on the MRI findings of probable high-grade disease would not recommend watchful waiting at this time since he a is in such excellent condition and right now is stage I with high probability of cure. I've gone over the risks and benefits of all  types of treatments. I would favor based on his excellent overall performance status and high likelihood of complete resection with robotic prostatectomy has my #1 choice followed closely by I-125 interstitial implant. He is approaching the cut off at 60 cc for implant again which tends towards favoring resection. Despite my recommendations with the patient is adamant on not having surgery and prefers to undergo I-125 interstitial implant. Risks and benefits of the procedure were reviewed with the patient and he seems to comprehend my treatment plan well. We will arrange for a volume study as well as for the actual seed implantation over the next several months. Patient is a follow-up appointment next week with urology and will discuss my recommendations at that time. Should his treatment choice change would be happy to reevaluate that at the time.  I would like to take this opportunity to thank you for allowing me to participate in the care of your patient.Armstead Peaks., MD

## 2015-08-27 ENCOUNTER — Ambulatory Visit (INDEPENDENT_AMBULATORY_CARE_PROVIDER_SITE_OTHER): Payer: Medicare Other | Admitting: Urology

## 2015-08-27 ENCOUNTER — Encounter: Payer: Self-pay | Admitting: Urology

## 2015-08-27 VITALS — BP 147/74 | HR 71 | Ht 70.0 in | Wt 190.3 lb

## 2015-08-27 DIAGNOSIS — N401 Enlarged prostate with lower urinary tract symptoms: Secondary | ICD-10-CM

## 2015-08-27 DIAGNOSIS — N138 Other obstructive and reflux uropathy: Secondary | ICD-10-CM

## 2015-08-27 DIAGNOSIS — C61 Malignant neoplasm of prostate: Secondary | ICD-10-CM | POA: Diagnosis not present

## 2015-08-27 NOTE — Progress Notes (Signed)
9:25 AM  08/27/15   Tony Mendez 1940-09-23 [id number]  Referring provider: Cletis Athens, MD [address] [place], [place] [address]  Chief Complaint  Patient presents with  . Follow-up    Prostate cancer    HPI: 75 year old male recently diagnosed with Gleason 3+3 prostate cancer status post prostate biopsy on 02/02/2015. Biopsy was performed for rising PSA as high 6.2/5.4 around the time of biopsy. He was noted to have a 55 g prostate. Pathology showed 7/12 cores positive for Gleason 3+3, 4 and the left involving up to 95% of the tissue, and 3 on the right which are low volume disease.  He returns today after undergoing a prostate MRI on 07/07/2015 for further surveillance.  This shows an area at the right base/mid gland which measures 1.7 cm which is concerning for high-grade carcinoma. There is no evidence of local invasion, metastatic disease, or pelvic adenopathy.  His PSA has remained relatively stable since his diagnosis.  He does have baseline urinary symptoms.  Pre-diagnosis IPSS 20/3. Urinary symptoms primarily include urinary frequency, weak stream, occasional sensation of incomplete bladder emptying and nocturia 3. He is previously tried Flomax which caused dizziness.  No baseline erectile dysfunction however is not sexually active and not worried about the quality of his erections.  No weight loss or bone pain.  No family history of prostate cancer.  Since his last visit, he was seen by Dr. Baruch Gouty and has elected to undergo brachytherapy seed implant. His prostate is borderline in size for this.    Component     Latest Ref Rng 05/20/2012 01/04/2015 01/06/2015 05/10/2015 07/27/2015  PSA     0.0 - 4.0 ng/mL 2.4 6.2 (H) 5.4 (H) 5.1 (H) 5.2 (H)    PMH: Past Medical History:  Diagnosis Date  . Arthritis   . BPH (benign prostatic hyperplasia)   . Cancer ([place])   . Elevated PSA 02/02/2015  . Gout   . Nocturia   . Prostate cancer ([place])   . Prostatitis      Surgical History: Past Surgical History:  Procedure Laterality Date  . APPENDECTOMY    . SKIN CANCER EXCISION    . TONSILLECTOMY      Home Medications:    Medication List       Accurate as of 08/27/15  9:25 AM. Always use your most recent med list.          aspirin 81 MG tablet Take 81 mg by mouth daily. Reported on 02/02/2015   CINNAMON PO Take by mouth daily.   FISH OIL PO Take by mouth daily.   GARLIC PO Take by mouth daily.   multivitamin tablet Take 1 tablet by mouth daily.       Allergies:  Allergies  Allergen Reactions  . Flomax [Tamsulosin Hcl] Other (See Comments)    Dizziness    Family History: Family History  Problem Relation Age of Onset  . Heart disease Father   . Kidney disease Father     one kidney removed  . Stroke Mother   . Prostate cancer Neg Hx     Social History:  reports that he quit smoking about 43 years ago. He has never used smokeless tobacco. He reports that he drinks alcohol. He reports that he does not use drugs.  ROS: UROLOGY Frequent Urination?: Yes Hard to postpone urination?: No Burning/pain with urination?: No Get up at night to urinate?: Yes Leakage of urine?: No Urine stream starts and stops?: Yes Trouble starting  stream?: Yes Do you have to strain to urinate?: No Blood in urine?: No Urinary tract infection?: No Sexually transmitted disease?: No Injury to kidneys or bladder?: No Painful intercourse?: No Weak stream?: Yes Erection problems?: No Penile pain?: No  Gastrointestinal Nausea?: No Vomiting?: No Indigestion/heartburn?: No Diarrhea?: No Constipation?: No  Constitutional Fever: No Night sweats?: No Weight loss?: No Fatigue?: No  Skin Skin rash/lesions?: No Itching?: No  Eyes Blurred vision?: No Double vision?: No  Ears/Nose/Throat Sore throat?: No Sinus problems?: No  Hematologic/Lymphatic Swollen glands?: No Easy bruising?: No  Cardiovascular Leg swelling?: No Chest  pain?: No  Respiratory Cough?: No Shortness of breath?: No  Endocrine Excessive thirst?: No  Musculoskeletal Back pain?: Yes Joint pain?: Yes  Neurological Headaches?: No Dizziness?: No  Psychologic Depression?: No Anxiety?: No  Physical Exam: BP (!) 147/74 (BP Location: Left Arm, Patient Position: Sitting, Cuff Size: Normal)   Pulse 71   Ht 5\' 10"  (1.778 m)   Wt 190 lb 4.8 oz (86.3 kg)   BMI 27.31 kg/m   Constitutional:  Alert and oriented, No acute distress.  Slightly younger than stated age in appearance. HEENT: [place] AT, moist mucus membranes.  Trachea midline, no masses. Cardiovascular: No clubbing, cyanosis, or edema. Respiratory: Normal respiratory effort, no increased work of breathing. Neurologic: Grossly intact, no focal deficits, moving all 4 extremities. Psychiatric: Normal mood and affect.  Laboratory Data: Lab Results  Component Value Date   WBC 8.1 02/13/2015   HGB 16.4 02/13/2015   HCT 47.6 02/13/2015   MCV 94.3 02/13/2015   PLT 277 02/13/2015    Lab Results  Component Value Date   CREATININE 0.96 07/07/2015   PSA trend as above    Assessment & Plan:    1. Prostate cancer [facility]) 75 year old male initially diagnosed with Gleason 3+3 involving 7 of 12 cores (high volume) in 1/17 with relative stable PSA since diagnosis. Unfortunately, however, MRI shows an area which is concerning for high-grade carcinoma at the right base/mid gland.   Spent some time today revisiting options and reviewing risk/ benefits of each.  Considered all options and has elected to proceed with brachytherapy.    Will arrange for procedure in conjunction with Dr. Baruch Gouty.  2. BPH (benign prostatic hyperplasia) with LUTS Baseline lower urinary urinary tract symptoms. Unable to tolerate Flomax. Not interested in any further medical management this time. We'll continue to follow.  Advised that urinary symptoms may worsen with brachytherapy and risk for retention.     Will schedule brachytherapy.   Hollice Espy, MD  [facility] [address] [place], [place] [address] [phone]

## 2015-09-06 ENCOUNTER — Other Ambulatory Visit: Payer: Self-pay | Admitting: Radiology

## 2015-09-06 DIAGNOSIS — C61 Malignant neoplasm of prostate: Secondary | ICD-10-CM

## 2015-09-07 ENCOUNTER — Telehealth: Payer: Self-pay | Admitting: Radiology

## 2015-09-07 NOTE — Telephone Encounter (Addendum)
Notified Tony Mendez of prostate volume study scheduled with Dr Erlene Quan & Dr Baruch Gouty on 09/27/15 with arrival time to [place] of 7:10am & to be npo after mn. Also, Brachytherapy scheduled on 11/01/15, pre-admit testing appt on 10/18/15 @10 :45 & to call Friday prior to procedure for arrival time to [facility]. Tony Mendez voices understanding. Also advised Tony Mendez to hold ASA 81mg  & fish oil 7 days prior to procedure. Tony Mendez voices understanding.

## 2015-09-27 ENCOUNTER — Ambulatory Visit
Admission: RE | Admit: 2015-09-27 | Discharge: 2015-09-27 | Disposition: A | Discharge status: Home / Self Care | Payer: Medicare Other | Source: Ambulatory Visit | Attending: Radiation Oncology | Admitting: Radiation Oncology

## 2015-09-27 ENCOUNTER — Encounter: Payer: Self-pay | Admitting: Radiation Oncology

## 2015-09-27 VITALS — BP 146/82 | HR 61 | Temp 96.4°F | Resp 20 | Wt 192.7 lb

## 2015-09-27 DIAGNOSIS — C61 Malignant neoplasm of prostate: Secondary | ICD-10-CM | POA: Insufficient documentation

## 2015-09-27 DIAGNOSIS — Z510 Encounter for antineoplastic radiation therapy: Secondary | ICD-10-CM | POA: Insufficient documentation

## 2015-09-27 SURGERY — ULTRASOUND, PROSTATE, FOR VOLUME DETERMINATION
Anesthesia: Choice

## 2015-09-27 MED ORDER — CEFAZOLIN IN D5W 1 GM/50ML IV SOLN: 1.0000 g | INTRAVENOUS | Status: DC

## 2015-09-27 NOTE — H&P (Signed)
Except an outstanding is perfect of Radiation Oncology NEW PATIENT EVALUATION  Name: Tony Mendez  MRN: [id number]  Date:   09/27/2015     DOB: 1940/05/17   This 75 y.o. male patient presents to the clinic for history of physical in preparation for I-125 interstitial implant for stage I prostate cancer  REFERRING PHYSICIAN: Cletis Athens, MD  CHIEF COMPLAINT:  Chief Complaint  Patient presents with  . Prostate Cancer    Pt is here post volume study    DIAGNOSIS: The encounter diagnosis was Malignant neoplasm of prostate ([place]).   PREVIOUS INVESTIGATIONS:  Pathology report reviewed Clinical notes reviewed MRI scan reviewed  HPI:  Patient is a 75 year old male excellent general condition originally consult at when he presented with a PSA of 6.2 found him prostate biopsy to have a Gleason 6 (3+3) developed involving 7 out of 12 core biopsies mostly on the right side. MRI showed an area concerning for high-grade carcinoma no evidence of metastatic disease or pelvic adenopathy was seen. He was given treatment options has opted for I-125 interstitial implant is seen today for his volume study in anticipation of his implant.Marland Kitchen He is seen today and doing well. He states he's having no specific symptoms at this time no increased lower urinary tract symptoms. He has been on daily aspirin which I've asked him to discontinue at least 2 weeks prior to his procedure.  PLANNED TREATMENT REGIMEN: I-125 interstitial implant  PAST MEDICAL HISTORY:  has a past medical history of Arthritis; BPH (benign prostatic hyperplasia); Cancer ([place]); Elevated PSA (02/02/2015); Gout; Nocturia; Prostate cancer ([place]); and Prostatitis.    PAST SURGICAL HISTORY:  Past Surgical History:  Procedure Laterality Date  . APPENDECTOMY    . SKIN CANCER EXCISION    . TONSILLECTOMY      FAMILY HISTORY: family history includes Heart disease in his father; Kidney disease in his father; Stroke in his mother.  SOCIAL  HISTORY:  reports that he quit smoking about 43 years ago. He has never used smokeless tobacco. He reports that he drinks alcohol. He reports that he does not use drugs.  ALLERGIES: Flomax [tamsulosin hcl]  MEDICATIONS:  Current Outpatient Prescriptions  Medication Sig Dispense Refill  . aspirin 81 MG tablet Take 81 mg by mouth daily. Reported on 02/02/2015    . CINNAMON PO Take by mouth daily.    Marland Kitchen GARLIC PO Take by mouth daily.    . Multiple Vitamin (MULTIVITAMIN) tablet Take 1 tablet by mouth daily.    . Omega-3 Fatty Acids (FISH OIL PO) Take by mouth daily.     No current facility-administered medications for this encounter.    Facility-Administered Medications Ordered in Other Encounters  Medication Dose Route Frequency Provider Last Rate Last Dose  . ceFAZolin (ANCEF) IVPB 1 g/50 mL premix  1 g Intravenous 30 min Pre-Op Hollice Espy, MD        ECOG PERFORMANCE STATUS:  0 - Asymptomatic  REVIEW OF SYSTEMS:  Patient denies any weight loss, fatigue, weakness, fever, chills or night sweats. Patient denies any loss of vision, blurred vision. Patient denies any ringing  of the ears or hearing loss. No irregular heartbeat. Patient denies heart murmur or history of fainting. Patient denies any chest pain or pain radiating to her upper extremities. Patient denies any shortness of breath, difficulty breathing at night, cough or hemoptysis. Patient denies any swelling in the lower legs. Patient denies any nausea vomiting, vomiting of blood, or coffee ground material in the vomitus.  Patient denies any stomach pain. Patient states has had normal bowel movements no significant constipation or diarrhea. Patient denies any dysuria, hematuria or significant nocturia. Patient denies any problems walking, swelling in the joints or loss of balance. Patient denies any skin changes, loss of hair or loss of weight. Patient denies any excessive worrying or anxiety or significant depression. Patient denies any  problems with insomnia. Patient denies excessive thirst, polyuria, polydipsia. Patient denies any swollen glands, patient denies easy bruising or easy bleeding. Patient denies any recent infections, allergies or URI. Patient "s visual fields have not changed significantly in recent time.    PHYSICAL EXAM: BP (!) 146/82   Pulse 61   Temp (!) 96.4 F (35.8 C)   Resp 20   Wt 192 lb 10.9 oz (87.4 kg)   BMI 27.65 kg/m  Well-developed well-nourished patient in NAD. HEENT reveals PERLA, EOMI, discs not visualized.  Oral cavity is clear. No oral mucosal lesions are identified. Neck is clear without evidence of cervical or supraclavicular adenopathy. Lungs are clear to A&P. Cardiac examination is essentially unremarkable with regular rate and rhythm without murmur rub or thrill. Abdomen is benign with no organomegaly or masses noted. Motor sensory and DTR levels are equal and symmetric in the upper and lower extremities. Cranial nerves II through XII are grossly intact. Proprioception is intact. No peripheral adenopathy or edema is identified. No motor or sensory levels are noted. Crude visual fields are within normal range.  LABORATORY DATA: Pathology reports reviewed    RADIOLOGY RESULTS: No films reviewed today except for volume study ultrasound as described in by him study note   IMPRESSION: Stage I adenocarcinoma the prostate Gleason 60 in 75 year old male for I-125 interstitial implant  PLAN: At this time patient will proceed with I-125 interstitial implant. I'm study is been performed today and we will determine number season placement based on that study. Risks and benefits of seed implant have again been reviewed with the patient. Consultations including those associated with general anesthesia, possible increasing urinary tract symptoms such as urgency and frequency, diarrhea, fatigue, alteration of blood counts, all were described in detail to the patient. He seems to comprehend my treatment  plan well. Consent signed today  I would like to take this opportunity to thank you for allowing me to participate in the care of your patient.Armstead Peaks., MD

## 2015-09-27 NOTE — Progress Notes (Signed)
Radiation Oncology Follow up Note  Name: Tony Mendez   Date:   09/27/2015 MRN:  [id number] DOB: 1940/02/28    This 74 y.o. male presents to the clinic today for volume study in anticipation of I-125 interstitial implant was stage I adenocarcinoma the prostate.  REFERRING PROVIDER: Cletis Athens, MD  HPI: Patient is a 75 year old male excellent general condition originally consult at when he presented with a PSA of 6.2 found him prostate biopsy to have a Gleason 6 (3+3) developed involving 7 out of 12 core biopsies mostly on the right side. MRI showed an area concerning for high-grade carcinoma no evidence of metastatic disease or pelvic adenopathy was seen. He was given treatment options has opted for I-125 interstitial implant is seen today for his volume study in anticipation of his implant..  COMPLICATIONS OF TREATMENT: none  FOLLOW UP COMPLIANCE: keeps appointments   PHYSICAL EXAM:  There were no vitals taken for this visit. Well-developed well-nourished patient in NAD. HEENT reveals PERLA, EOMI, discs not visualized.  Oral cavity is clear. No oral mucosal lesions are identified. Neck is clear without evidence of cervical or supraclavicular adenopathy. Lungs are clear to A&P. Cardiac examination is essentially unremarkable with regular rate and rhythm without murmur rub or thrill. Abdomen is benign with no organomegaly or masses noted. Motor sensory and DTR levels are equal and symmetric in the upper and lower extremities. Cranial nerves II through XII are grossly intact. Proprioception is intact. No peripheral adenopathy or edema is identified. No motor or sensory levels are noted. Crude visual fields are within normal range.  RADIOLOGY RESULTS: Ultrasound used for combined study  PLAN: Patient was taken to the cystoscopy suite in the OR. Patient was placed in the low lithotomy position. Foley catheter was placed. Trans-rectal ultrasound probe was inserted into the rectum and  prostate seminal vesicles were visualized as well as bladder base. stepping images were performed on a 5 mm increments. Images will be placed in [facility] treatment planning system to determine seed placement coordinates for eventual I-125 interstitial implant. Images will be reviewed with the physics and dosimetry staff for final quality approval. I personally was present for the volume study and assisted in delineation of contour volumes.  At the end of the procedure Foley catheter was removed, rectal ultrasound probe was removed. Patient tolerated his procedures extremely well with no side effects or complaints. Patient has given appointment for interstitial implant date. Consent was signed today as well as history and physical performed in preparation for his outpatient surgical implant.  I would like to take this opportunity to thank you for allowing me to participate in the care of your patient.Armstead Peaks., MD

## 2015-09-28 ENCOUNTER — Ambulatory Visit: Admission: RE | Admit: 2015-09-28 | Payer: Medicare Other | Source: Ambulatory Visit | Admitting: Urology

## 2015-10-04 DIAGNOSIS — C61 Malignant neoplasm of prostate: Secondary | ICD-10-CM | POA: Diagnosis not present

## 2015-10-04 DIAGNOSIS — Z510 Encounter for antineoplastic radiation therapy: Secondary | ICD-10-CM | POA: Diagnosis not present

## 2015-10-18 ENCOUNTER — Encounter
Admission: RE | Admit: 2015-10-18 | Discharge: 2015-10-18 | Disposition: A | Discharge status: Home / Self Care | Payer: Medicare Other | Source: Ambulatory Visit | Attending: Urology | Admitting: Urology

## 2015-10-18 DIAGNOSIS — C61 Malignant neoplasm of prostate: Secondary | ICD-10-CM

## 2015-10-18 DIAGNOSIS — Z01818 Encounter for other preprocedural examination: Secondary | ICD-10-CM | POA: Diagnosis not present

## 2015-10-18 LAB — CBC
HEMATOCRIT: 44 % (ref 40.0–52.0)
Hemoglobin: 15.5 g/dL (ref 13.0–18.0)
MCH: 34.2 pg — AB (ref 26.0–34.0)
MCHC: 35.3 g/dL (ref 32.0–36.0)
MCV: 96.7 fL (ref 80.0–100.0)
Platelets: 242 10*3/uL (ref 150–440)
RBC: 4.55 MIL/uL (ref 4.40–5.90)
RDW: 13.3 % (ref 11.5–14.5)
WBC: 6.9 10*3/uL (ref 3.8–10.6)

## 2015-10-18 MED ORDER — FLEET ENEMA 7-19 GM/118ML RE ENEM
1.0000 | ENEMA | Freq: Once | RECTAL | Status: DC
  Filled 2015-10-18: qty 1

## 2015-10-18 NOTE — Patient Instructions (Addendum)
  Your procedure is scheduled on: 11/01/15 Monday Report to Day Surgery. To find out your arrival time please call [phone] between 1PM - 3PM on Friday 10/29/15.  Remember: Instructions that are not followed completely may result in serious medical risk, up to and including death, or upon the discretion of your surgeon and anesthesiologist your surgery may need to be rescheduled.    _x___ 1. Do not eat food or drink liquids after midnight. No gum chewing or hard candies.     __x__ 2. No Alcohol for 24 hours before or after surgery.   ____ 3. Do Not Smoke For 24 Hours Prior to Your Surgery.   ____ 4. Bring all medications with you on the day of surgery if instructed.    __x__ 5. Notify your doctor if there is any change in your medical condition     (cold, fever, infections).       Do not wear jewelry, make-up, hairpins, clips or nail polish.  Do not wear lotions, powders, or perfumes. You may wear deodorant.  Do not shave 48 hours prior to surgery. Men may shave face and neck.  Do not bring valuables to the hospital.    [facility] is not responsible for any belongings or valuables.               Contacts, dentures or bridgework may not be worn into surgery.  Leave your suitcase in the car. After surgery it may be brought to your room.  For patients admitted to the hospital, discharge time is determined by your                treatment team.   Patients discharged the day of surgery will not be allowed to drive home.   Please read over the following fact sheets that you were given:      _x___ Take these medicines the morning of surgery with A SIP OF WATER:    1. none  2.   3.   4.  5.  6.  ____ Fleet Enema (as directed)   __x__  Shower night before and morning of with regular soap  ____ Use inhalers on the day of surgery  ____ Stop metformin 2 days prior to surgery    ____ Take 1/2 of usual insulin dose the night before surgery and none on the morning of  surgery.   __x__ Stop aspirin on today per MD order  __x__ Stop Anti-inflammatories on 10/25/15 Tylenol OK   __x__ Stop supplements until after surgery.  10/22/15  ____ Bring C-Pap to the hospital.  Use fleets enema the morning of surgery and start clear liqid diet 48 hours before surgery

## 2015-10-18 NOTE — Pre-Procedure Instructions (Signed)
Dr. Jennette Kettle office notified per fax that a cardiac clearance is needed.

## 2015-10-19 DIAGNOSIS — Z01818 Encounter for other preprocedural examination: Secondary | ICD-10-CM | POA: Diagnosis not present

## 2015-10-21 DIAGNOSIS — I10 Essential (primary) hypertension: Secondary | ICD-10-CM | POA: Diagnosis not present

## 2015-10-21 DIAGNOSIS — R079 Chest pain, unspecified: Secondary | ICD-10-CM | POA: Diagnosis not present

## 2015-10-21 DIAGNOSIS — E785 Hyperlipidemia, unspecified: Secondary | ICD-10-CM | POA: Diagnosis not present

## 2015-10-25 ENCOUNTER — Ambulatory Visit: Payer: Medicare Other | Admitting: Radiation Oncology

## 2015-10-25 NOTE — Progress Notes (Signed)
Cardiac clearance from Dr. Lavera Guise was received on 10/19/2015; patient is a low risk

## 2015-10-26 ENCOUNTER — Ambulatory Visit: Payer: Medicare Other | Admitting: Radiation Oncology

## 2015-11-01 ENCOUNTER — Ambulatory Visit
Admission: RE | Admit: 2015-11-01 | Discharge: 2015-11-01 | Disposition: A | Discharge status: Home / Self Care | Payer: Medicare Other | Source: Ambulatory Visit | Attending: Radiation Oncology | Admitting: Radiation Oncology

## 2015-11-01 ENCOUNTER — Encounter
Admission: RE | Disposition: A | Discharge status: Home / Self Care | Payer: Self-pay | Source: Ambulatory Visit | Attending: Urology

## 2015-11-01 ENCOUNTER — Ambulatory Visit: Payer: Medicare Other | Admitting: Anesthesiology

## 2015-11-01 ENCOUNTER — Ambulatory Visit
Admission: RE | Admit: 2015-11-01 | Discharge: 2015-11-01 | Disposition: A | Discharge status: Home / Self Care | Payer: Medicare Other | Source: Ambulatory Visit | Attending: Urology | Admitting: Urology

## 2015-11-01 ENCOUNTER — Encounter: Payer: Self-pay | Admitting: *Deleted

## 2015-11-01 DIAGNOSIS — C61 Malignant neoplasm of prostate: Secondary | ICD-10-CM | POA: Insufficient documentation

## 2015-11-01 DIAGNOSIS — N401 Enlarged prostate with lower urinary tract symptoms: Secondary | ICD-10-CM | POA: Insufficient documentation

## 2015-11-01 DIAGNOSIS — Z510 Encounter for antineoplastic radiation therapy: Secondary | ICD-10-CM | POA: Diagnosis not present

## 2015-11-01 DIAGNOSIS — Z79899 Other long term (current) drug therapy: Secondary | ICD-10-CM | POA: Insufficient documentation

## 2015-11-01 DIAGNOSIS — Z7982 Long term (current) use of aspirin: Secondary | ICD-10-CM | POA: Insufficient documentation

## 2015-11-01 DIAGNOSIS — R351 Nocturia: Secondary | ICD-10-CM | POA: Diagnosis not present

## 2015-11-01 DIAGNOSIS — Z85828 Personal history of other malignant neoplasm of skin: Secondary | ICD-10-CM | POA: Insufficient documentation

## 2015-11-01 DIAGNOSIS — R972 Elevated prostate specific antigen [PSA]: Secondary | ICD-10-CM

## 2015-11-01 DIAGNOSIS — Z87891 Personal history of nicotine dependence: Secondary | ICD-10-CM | POA: Diagnosis not present

## 2015-11-01 HISTORY — PX: RADIOACTIVE SEED IMPLANT: SHX5150

## 2015-11-01 HISTORY — PX: CYSTOSCOPY: SHX5120

## 2015-11-01 SURGERY — INSERTION, RADIATION SOURCE, PROSTATE
Anesthesia: General | Wound class: Clean Contaminated

## 2015-11-01 MED ORDER — GLYCOPYRROLATE 0.2 MG/ML IJ SOLN
INTRAMUSCULAR | Status: DC | PRN
  Administered 2015-11-01: 0.2 mg via INTRAVENOUS

## 2015-11-01 MED ORDER — LACTATED RINGERS IV SOLN
INTRAVENOUS | Status: DC
  Administered 2015-11-01: 07:00:00 via INTRAVENOUS

## 2015-11-01 MED ORDER — BACITRACIN ZINC 500 UNIT/GM EX OINT
TOPICAL_OINTMENT | CUTANEOUS | Status: AC
  Filled 2015-11-01: qty 1.8

## 2015-11-01 MED ORDER — LIDOCAINE HCL (CARDIAC) 20 MG/ML IV SOLN
INTRAVENOUS | Status: DC | PRN
  Administered 2015-11-01: 80 mg via INTRAVENOUS

## 2015-11-01 MED ORDER — DOCUSATE SODIUM 100 MG PO CAPS: 100.0000 mg | ORAL_CAPSULE | Freq: Two times a day (BID) | ORAL | 0 refills | Status: DC

## 2015-11-01 MED ORDER — OXYCODONE HCL 5 MG PO TABS: 5.0000 mg | ORAL_TABLET | Freq: Once | ORAL | Status: DC | PRN

## 2015-11-01 MED ORDER — EPHEDRINE SULFATE 50 MG/ML IJ SOLN
INTRAMUSCULAR | Status: DC | PRN
  Administered 2015-11-01 (×4): 5 mg via INTRAVENOUS

## 2015-11-01 MED ORDER — FAMOTIDINE 20 MG PO TABS
20.0000 mg | ORAL_TABLET | Freq: Once | ORAL | Status: AC
  Administered 2015-11-01: 20 mg via ORAL

## 2015-11-01 MED ORDER — PROPOFOL 10 MG/ML IV BOLUS
INTRAVENOUS | Status: DC | PRN
  Administered 2015-11-01: 30 mg via INTRAVENOUS
  Administered 2015-11-01: 170 mg via INTRAVENOUS

## 2015-11-01 MED ORDER — ONDANSETRON HCL 4 MG/2ML IJ SOLN
INTRAMUSCULAR | Status: DC | PRN
  Administered 2015-11-01: 4 mg via INTRAVENOUS

## 2015-11-01 MED ORDER — FAMOTIDINE 20 MG PO TABS
ORAL_TABLET | ORAL | Status: AC
  Administered 2015-11-01: 20 mg via ORAL
  Filled 2015-11-01: qty 1

## 2015-11-01 MED ORDER — FENTANYL CITRATE (PF) 100 MCG/2ML IJ SOLN
INTRAMUSCULAR | Status: DC | PRN
  Administered 2015-11-01 (×4): 50 ug via INTRAVENOUS

## 2015-11-01 MED ORDER — OXYCODONE HCL 5 MG/5ML PO SOLN: 5.0000 mg | Freq: Once | ORAL | Status: DC | PRN

## 2015-11-01 MED ORDER — BACITRACIN 500 UNIT/GM EX OINT
TOPICAL_OINTMENT | CUTANEOUS | Status: DC | PRN
  Administered 2015-11-01: 2 via TOPICAL

## 2015-11-01 MED ORDER — FENTANYL CITRATE (PF) 100 MCG/2ML IJ SOLN: 25.0000 ug | INTRAMUSCULAR | Status: DC | PRN

## 2015-11-01 MED ORDER — HYDROCODONE-ACETAMINOPHEN 5-325 MG PO TABS: 1.0000 | ORAL_TABLET | Freq: Four times a day (QID) | ORAL | 0 refills | Status: DC | PRN

## 2015-11-01 MED ORDER — CIPROFLOXACIN HCL 500 MG PO TABS: 500.0000 mg | ORAL_TABLET | Freq: Two times a day (BID) | ORAL | 0 refills | Status: DC

## 2015-11-01 SURGICAL SUPPLY — 32 items
BAG URO DRAIN 2000ML W/SPOUT (MISCELLANEOUS) ×4 IMPLANT
BLADE CLIPPER SURG (BLADE) ×4 IMPLANT
CATH FOL 2WAY LX 16X5 (CATHETERS) ×4 IMPLANT
CATH ROBINSON RED A/P 16FR (CATHETERS) IMPLANT
DRAPE INCISE 23X17 IOBAN STRL (DRAPES) ×2
DRAPE INCISE IOBAN 23X17 STRL (DRAPES) ×2 IMPLANT
DRAPE SHEET LG 3/4 BI-LAMINATE (DRAPES) ×4 IMPLANT
DRAPE TABLE BACK 80X90 (DRAPES) ×4 IMPLANT
DRAPE UNDER BUTTOCK W/FLU (DRAPES) IMPLANT
DRSG TELFA 3X8 NADH (GAUZE/BANDAGES/DRESSINGS) ×4 IMPLANT
GLOVE BIO SURGEON STRL SZ 6.5 (GLOVE) ×6 IMPLANT
GLOVE BIO SURGEON STRL SZ7 (GLOVE) ×8 IMPLANT
GLOVE BIO SURGEON STRL SZ7.5 (GLOVE) ×8 IMPLANT
GLOVE BIO SURGEONS STRL SZ 6.5 (GLOVE) ×2
GOWN STRL REUS W/ TWL LRG LVL3 (GOWN DISPOSABLE) ×4 IMPLANT
GOWN STRL REUS W/ TWL XL LVL3 (GOWN DISPOSABLE) ×2 IMPLANT
GOWN STRL REUS W/TWL LRG LVL3 (GOWN DISPOSABLE) ×4
GOWN STRL REUS W/TWL XL LVL3 (GOWN DISPOSABLE) ×2
GRADUATE 1200CC STRL 31836 (MISCELLANEOUS) ×4 IMPLANT
IV NS 1000ML (IV SOLUTION) ×2
IV NS 1000ML BAXH (IV SOLUTION) ×2 IMPLANT
KIT RM TURNOVER CYSTO AR (KITS) ×4 IMPLANT
PACK CYSTO AR (MISCELLANEOUS) ×4 IMPLANT
PAD PREP 24X41 OB/GYN DISP (PERSONAL CARE ITEMS) ×4 IMPLANT
SET CYSTO W/LG BORE CLAMP LF (SET/KITS/TRAYS/PACK) ×4 IMPLANT
SOL PREP PVP 2OZ (MISCELLANEOUS) ×4
SOLUTION PREP PVP 2OZ (MISCELLANEOUS) ×2 IMPLANT
SURGILUBE 2OZ TUBE FLIPTOP (MISCELLANEOUS) ×4 IMPLANT
SYRINGE 10CC LL (SYRINGE) ×4 IMPLANT
SYRINGE IRR TOOMEY STRL 70CC (SYRINGE) ×4 IMPLANT
WATER STERILE IRR 1000ML POUR (IV SOLUTION) ×4 IMPLANT
WATER STERILE IRR 3000ML UROMA (IV SOLUTION) ×4 IMPLANT

## 2015-11-01 NOTE — H&P (Signed)
Radiation Oncology    [] Hide copied text Except an outstanding is perfect of Radiation Oncology NEW PATIENT EVALUATION  Name: Tony Mendez         MRN: [id number]        Date:   09/27/2015     DOB: 1940-06-18   This 75 y.o. male patient presents to the clinic for history of physical in preparation for I-125 interstitial implant for stage I prostate cancer  REFERRING PHYSICIAN: Cletis Athens, MD  CHIEF COMPLAINT:      Chief Complaint  Patient presents with  . Prostate Cancer    Pt is here post volume study    DIAGNOSIS: The encounter diagnosis was Malignant neoplasm of prostate ([place]).   PREVIOUS INVESTIGATIONS:  Pathology report reviewed Clinical notes reviewed MRI scan reviewed  HPI: Patient is a 75 year old male excellent general condition originally consult at when he presented with a PSA of 6.2 found him prostate biopsy to have a Gleason 6 (3+3) developed involving 7 out of 12 core biopsies mostly on the right side. MRI showed an area concerning for high-grade carcinoma no evidence of metastatic disease or pelvic adenopathy was seen. He was given treatment options has opted for I-125 interstitial implant is seen today for his volume study in anticipation of his implant.Marland Kitchen He is seen today and doing well. He states he's having no specific symptoms at this time no increased lower urinary tract symptoms. He has been on daily aspirin which I've asked him to discontinue at least 2 weeks prior to his procedure.  PLANNED TREATMENT REGIMEN: I-125 interstitial implant  PAST MEDICAL HISTORY:  has a past medical history of Arthritis; BPH (benign prostatic hyperplasia); Cancer ([place]); Elevated PSA (02/02/2015); Gout; Nocturia; Prostate cancer ([place]); and Prostatitis.    PAST SURGICAL HISTORY:       Past Surgical History:  Procedure Laterality Date  . APPENDECTOMY    . SKIN CANCER EXCISION    . TONSILLECTOMY      FAMILY HISTORY: family history includes Heart  disease in his father; Kidney disease in his father; Stroke in his mother.  SOCIAL HISTORY:  reports that he quit smoking about 43 years ago. He has never used smokeless tobacco. He reports that he drinks alcohol. He reports that he does not use drugs.  ALLERGIES: Flomax [tamsulosin hcl]  MEDICATIONS:        Current Outpatient Prescriptions  Medication Sig Dispense Refill  . aspirin 81 MG tablet Take 81 mg by mouth daily. Reported on 02/02/2015    . CINNAMON PO Take by mouth daily.    Marland Kitchen GARLIC PO Take by mouth daily.    . Multiple Vitamin (MULTIVITAMIN) tablet Take 1 tablet by mouth daily.    . Omega-3 Fatty Acids (FISH OIL PO) Take by mouth daily.     No current facility-administered medications for this encounter.             Facility-Administered Medications Ordered in Other Encounters  Medication Dose Route Frequency Provider Last Rate Last Dose  . ceFAZolin (ANCEF) IVPB 1 g/50 mL premix  1 g Intravenous 30 min Pre-Op Hollice Espy, MD        ECOG PERFORMANCE STATUS:  0 - Asymptomatic  REVIEW OF SYSTEMS:  Patient denies any weight loss, fatigue, weakness, fever, chills or night sweats. Patient denies any loss of vision, blurred vision. Patient denies any ringing  of the ears or hearing loss. No irregular heartbeat. Patient denies heart murmur or history of fainting. Patient denies any chest  pain or pain radiating to her upper extremities. Patient denies any shortness of breath, difficulty breathing at night, cough or hemoptysis. Patient denies any swelling in the lower legs. Patient denies any nausea vomiting, vomiting of blood, or coffee ground material in the vomitus. Patient denies any stomach pain. Patient states has had normal bowel movements no significant constipation or diarrhea. Patient denies any dysuria, hematuria or significant nocturia. Patient denies any problems walking, swelling in the joints or loss of balance. Patient denies any skin changes, loss of  hair or loss of weight. Patient denies any excessive worrying or anxiety or significant depression. Patient denies any problems with insomnia. Patient denies excessive thirst, polyuria, polydipsia. Patient denies any swollen glands, patient denies easy bruising or easy bleeding. Patient denies any recent infections, allergies or URI. Patient "s visual fields have not changed significantly in recent time.    PHYSICAL EXAM: BP (!) 146/82   Pulse 61   Temp (!) 96.4 F (35.8 C)   Resp 20   Wt 192 lb 10.9 oz (87.4 kg)   BMI 27.65 kg/m  Well-developed well-nourished patient in NAD. HEENT reveals PERLA, EOMI, discs not visualized.  Oral cavity is clear. No oral mucosal lesions are identified. Neck is clear without evidence of cervical or supraclavicular adenopathy. Lungs are clear to A&P. Cardiac examination is essentially unremarkable with regular rate and rhythm without murmur rub or thrill. Abdomen is benign with no organomegaly or masses noted. Motor sensory and DTR levels are equal and symmetric in the upper and lower extremities. Cranial nerves II through XII are grossly intact. Proprioception is intact. No peripheral adenopathy or edema is identified. No motor or sensory levels are noted. Crude visual fields are within normal range.  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 I'm study is been performed today and we will determine number season placement based on that study. Risks and benefits of seed implant have again been reviewed with the patient. Consultations including those associated with general anesthesia, possible increasing urinary tract symptoms such as urgency and frequency,  diarrhea, fatigue, alteration of blood counts, all were described in detail to the patient. He seems to comprehend my treatment plan well. Consent signed today  I would like to take this opportunity to thank you for allowing me to participate in the care of your patient.Armstead Peaks., MD

## 2015-11-01 NOTE — Progress Notes (Signed)
Radiation Oncology Follow up Note  Name: Tony Mendez   Date:   09/06/2015 MRN:  [id number] DOB: 01-26-1940    I-125 interstitial implant   This 75 y.o. male presents to the clinic today for I-125 interstitial implant for Gleason 6 adenocarcinoma of the prostate presenting the PSA of 6.2.  REFERRING PROVIDER: No ref. provider found  HPI: Patient is a 75 year old male presented with a PSA of 6.2 found to have a Gleason 6 (3+3) adenocarcinoma the prostate. He opted for I-125 interstitial implant and was at seen today for interstitial implant..  COMPLICATIONS OF TREATMENT: none  FOLLOW UP COMPLIANCE: keeps appointments   PHYSICAL EXAM:  BP 139/84   Pulse 92   Temp 97.6 F (36.4 C) (Oral)   Resp 14   SpO2 95%  Well-developed well-nourished patient in NAD. HEENT reveals PERLA, EOMI, discs not visualized.  Oral cavity is clear. No oral mucosal lesions are identified. Neck is clear without evidence of cervical or supraclavicular adenopathy. Lungs are clear to A&P. Cardiac examination is essentially unremarkable with regular rate and rhythm without murmur rub or thrill. Abdomen is benign with no organomegaly or masses noted. Motor sensory and DTR levels are equal and symmetric in the upper and lower extremities. Cranial nerves II through XII are grossly intact. Proprioception is intact. No peripheral adenopathy or edema is identified. No motor or sensory levels are noted. Crude visual fields are within normal range.  patient was taken to the operating room and general anesthesia was administered. Legs were immobilized in stirrups and patient was positioned in the exact same proportions as original volume study. Patient was prepped and Foley catheter was placed. Ultrasound guidance identified the prostate and recreated the original set up as per treatment planning volume study. 17 needles were placed under ultrasound guidance with PVCs delivered to the prostate volume. After completion of  procedure cystoscopy was performed by urology and no evidence of seeds in the bladder were noted. Patient tolerated the procedure extremely well. Initial plain film as doublecheck identified 55 seeds in the prostate. Patient has followup appointment in one month for CT scan for quality assurance will be performed.   RADIOLOGY RESULTS: Cystoscopy was performed showing no evidence of seeds in the bladder. Plain film confirmation verified 55 seeds implanted in the prostate.  PLAN: Patient will see him back in 1 month for CT quality assurance scan. He is under the guidance of urology this point. Patient is allergic to Flomax so will not be on that medication at this time. Patient is to call urology or radiation oncology for any questions. Radiation safety information has been provided and explained in detail to the patient.  I would like to take this opportunity to thank you for allowing me to participate in the care of your patient.Armstead Peaks., MD

## 2015-11-01 NOTE — Anesthesia Postprocedure Evaluation (Signed)
Anesthesia Post Note  Patient: DERREK CAVNESS  Procedure(s) Performed: Procedure(s) (LRB): RADIOACTIVE SEED IMPLANT/BRACHYTHERAPY IMPLANT (N/A) CYSTOSCOPY  Patient location during evaluation: PACU Anesthesia Type: General Level of consciousness: awake and alert Pain management: pain level controlled Vital Signs Assessment: post-procedure vital signs reviewed and stable Respiratory status: spontaneous breathing, nonlabored ventilation, respiratory function stable and patient connected to nasal cannula oxygen Cardiovascular status: blood pressure returned to baseline and stable Postop Assessment: no signs of nausea or vomiting Anesthetic complications: no    Last Vitals:  Vitals:   11/01/15 0910 11/01/15 0925  BP: 132/68 (!) 141/75  Pulse: 75 68  Resp: 14 11  Temp:  36.8 C    Last Pain:  Vitals:   11/01/15 0925  TempSrc:   PainSc: 3                  Precious Haws Maxwel [place]

## 2015-11-01 NOTE — Discharge Instructions (Signed)
AMBULATORY SURGERY  DISCHARGE INSTRUCTIONS   1) The drugs that you were given will stay in your system until tomorrow so for the next 24 hours you should not:  A) Drive an automobile B) Make any legal decisions C) Drink any alcoholic beverage   2) You may resume regular meals tomorrow.  Today it is better to start with liquids and gradually work up to solid foods.  You may eat anything you prefer, but it is better to start with liquids, then soup and crackers, and gradually work up to solid foods.   3) Please notify your doctor immediately if you have any unusual bleeding, trouble breathing, redness and pain at the surgery site, drainage, fever, or pain not relieved by medication.    4) Additional Instructions:        Please contact your physician with any problems or Same Day Surgery at [phone], Monday through Friday 6 am to 4 pm, or  at [facility] number at [phone].AMBULATORY SURGERY         Brachytherapy for Prostate Cancer, Care After Refer to this sheet in the next few weeks. These instructions provide you with information on caring for yourself after your procedure. Your health care provider may also give you more specific instructions. Your treatment has been planned according to current medical practices, but problems sometimes occur. Call your health care provider if you have any problems or questions after your procedure. WHAT TO EXPECT AFTER THE PROCEDURE The area behind the scrotum will probably be tender and bruised. For a short period of time you may have:  Difficulty passing urine. You may need a catheter for a few days to a month.  Blood in the urine or semen.  A feeling of constipation because of prostate swelling.  Frequent feeling of an urgent need to urinate. For a long period of time you may have:  Inflammation of the rectum. This happens in about 2% of people who have the procedure.  Erection problems. These vary with  age and occur in about 15-40% of men.  Difficulty urinating. This is caused by scarring in the urethra.  Diarrhea. HOME CARE INSTRUCTIONS   Take medicines only as directed by your health care provider.  You will probably have a catheter in your bladder for several days. You will have blood in the urine bag and should drink a lot of fluids to keep it a light red color.  Keep all follow-up visits as directed by your health care provider. If you have a catheter, it will be removed during one of these visits.  Try not to sit directly on the area behind the scrotum. A soft cushion can decrease the discomfort. Ice packs may also be helpful for the discomfort. Do not put ice directly on the skin.  Shower and wash the area behind the scrotum gently. Do not sit in a tub.  If you have had the brachytherapy that uses the seeds, limit your close contact with children and pregnant women for 2 months because of the radiation still in the prostate. After that period of time, the levels drop off quickly. SEEK IMMEDIATE MEDICAL CARE IF:   You have a fever.  You have chills.  You have shortness of breath.  You have chest pain.  You have thick blood, like tomato juice, in the urine bag.  Your catheter is blocked so urine cannot get into the bag. Your bladder area or lower abdomen may be swollen.  There is excessive bleeding  from your rectum. It is normal to have a little blood mixed with your stool.  There is severe discomfort in the treated area that does not go away with pain medicine.  You have abdominal discomfort.  You have severe nausea or vomiting.  You develop any new or unusual symptoms.   This information is not intended to replace advice given to you by your health care provider. Make sure you discuss any questions you have with your health care provider.   Document Released: 02/04/2010 Document Revised: 01/23/2014 Document Reviewed: 06/25/2012 Elsevier Interactive Patient  Education [employer].

## 2015-11-01 NOTE — Anesthesia Preprocedure Evaluation (Addendum)
Anesthesia Evaluation  Patient identified by MRN, date of birth, ID band Patient awake    Reviewed: Allergy & Precautions, H&P , NPO status , Patient's Chart, lab work & pertinent test results  History of Anesthesia Complications Negative for: history of anesthetic complications  Airway Mallampati: III  TM Distance: <3 FB Neck ROM: limited    Dental  (+) Poor Dentition, Chipped   Pulmonary neg shortness of breath, former smoker,    Pulmonary exam normal breath sounds clear to auscultation       Cardiovascular Exercise Tolerance: Good (-) angina(-) Past MI and (-) DOE negative cardio ROS Normal cardiovascular exam Rhythm:regular Rate:Normal     Neuro/Psych negative neurological ROS  negative psych ROS   GI/Hepatic Neg liver ROS, GERD  Controlled,  Endo/Other  negative endocrine ROS  Renal/GU negative Renal ROS  negative genitourinary   Musculoskeletal  (+) Arthritis ,   Abdominal   Peds  Hematology negative hematology ROS (+)   Anesthesia Other Findings Past Medical History: No date: Arthritis No date: BPH (benign prostatic hyperplasia) No date: Cancer ([place]) 02/02/2015: Elevated PSA No date: Gout No date: Nocturia No date: Prostate cancer ([facility]) No date: Prostatitis  Past Surgical History: No date: APPENDECTOMY No date: SKIN CANCER EXCISION No date: TONSILLECTOMY     Reproductive/Obstetrics negative OB ROS                             Anesthesia Physical Anesthesia Plan  ASA: III  Anesthesia Plan: General LMA   Post-op Pain Management:    Induction:   Airway Management Planned:   Additional Equipment:   Intra-op Plan:   Post-operative Plan:   Informed Consent: I have reviewed the patients History and Physical, chart, labs and discussed the procedure including the risks, benefits and alternatives for the proposed anesthesia with the patient or authorized  representative who has indicated his/her understanding and acceptance.   Dental Advisory Given  Plan Discussed with: Anesthesiologist, CRNA and Surgeon  Anesthesia Plan Comments:        Anesthesia Quick Evaluation

## 2015-11-01 NOTE — Transfer of Care (Signed)
Immediate Anesthesia Transfer of Care Note  Patient: Tony Mendez  Procedure(s) Performed: Procedure(s): RADIOACTIVE SEED IMPLANT/BRACHYTHERAPY IMPLANT (N/A)  Patient Location: PACU  Anesthesia Type:General  Level of Consciousness: awake, alert  and responds to stimulation  Airway & Oxygen Therapy: Patient Spontanous Breathing and Patient connected to face mask oxygen  Post-op Assessment: Report given to RN and Post -op Vital signs reviewed and stable  Post vital signs: Reviewed and stable  Last Vitals:  Vitals:   11/01/15 0639 11/01/15 0840  BP: (!) 158/91 125/82  Pulse: 67 87  Resp: 16 17  Temp: 36.1 C     Last Pain:  Vitals:   11/01/15 0639  TempSrc: Tympanic         Complications: No apparent anesthesia complications

## 2015-11-01 NOTE — Anesthesia Procedure Notes (Signed)
Procedure Name: LMA Insertion Performed by: Lance Muss Pre-anesthesia Checklist: Patient identified, Patient being monitored, Timeout performed, Emergency Drugs available and Suction available Patient Re-evaluated:Patient Re-evaluated prior to inductionOxygen Delivery Method: Circle system utilized Preoxygenation: Pre-oxygenation with 100% oxygen Intubation Type: IV induction Ventilation: Mask ventilation without difficulty LMA: LMA inserted LMA Size: 4.0 Tube type: Oral Number of attempts: 2 Placement Confirmation: positive ETCO2 and breath sounds checked- equal and bilateral Tube secured with: Tape Dental Injury: Teeth and Oropharynx as per pre-operative assessment

## 2015-11-01 NOTE — Op Note (Signed)
11/01/15  Preoperative diagnosis: Adenocarcinoma of the prostate   Postoperative diagnosis: Same   Procedure: I-125 prostate seed implantation, cystoscopy  Surgeon: Hollice Espy M.D. , Lavena Stanford, M.D.   Anesthesia: General  Drains: none  Complications: none  Indications: 75 year old male with Gleason 3+3 prostate cancer previously on active surveillance who underwent prostate MRI which showed a concerning area at the right base for her grade carcinoma. As such he is recommended to undergo further intervention. He is elected to proceed with brachytherapy.  Procedure: Patient was brought to operating suite and placement table in the supine position. At this time, a universal timeout protocol was performed, all team members were identified, Venodyne boots are placed, and he was administered IV Ancef in the preoperative period. He was placed in lithotomy position and prepped and draped in usual manner. Radiation oncology department placed a transrectal ultrasound probe anchoring stand/ grid and aligned with previous imaging from the volume study. Foley catheter was inserted without difficulty.  All needle passage was done with real-time transrectal ultrasound guidance in both the transverse and sagittal plains in order to achieve the desired preplanned position. A total of 17 needles were placed.  55 active seeds were implanted. The Foley catheter was removed and a rigid cystoscopy failed to show any seeds outside the prostate without evidence of trauma to the urethral, prostatic fossa, or bladder.  The bladder was drained.  A fluoroscopic image was then obtained showing excellent distrubution of the brachytherapy seeds.  Each seed was counted and counts were correct.    The patient was then repositioned in the supine position, reversed from anesthesia, and taken to the PACU in stable condition.  Hollice Espy, MD

## 2015-11-02 ENCOUNTER — Telehealth: Payer: Self-pay | Admitting: Urology

## 2015-11-02 ENCOUNTER — Encounter: Payer: Self-pay | Admitting: Urology

## 2015-11-02 DIAGNOSIS — N3289 Other specified disorders of bladder: Secondary | ICD-10-CM

## 2015-11-02 MED ORDER — OXYBUTYNIN CHLORIDE 5 MG PO TABS: 5.0000 mg | ORAL_TABLET | Freq: Two times a day (BID) | ORAL | 0 refills | Status: DC

## 2015-11-02 NOTE — Telephone Encounter (Signed)
Patient is calling and asking when is is supposed to have his catheter removed? I didn't see any notes in his discharge summory.   Sharyn Lull

## 2015-11-02 NOTE — Telephone Encounter (Signed)
Thursday nurse visit for voiding trial  Hollice Espy, MD

## 2015-11-02 NOTE — Telephone Encounter (Signed)
appt has been made, patient is asking what he can do for bladder spasm? Is there something he can take or is this just a normal part of the catheter?   Tony Mendez

## 2015-11-02 NOTE — Telephone Encounter (Signed)
Spoke with pt in reference to bladder spams. Reinforced with pt not able to take ditropan 24hr prior to voiding trial. Pt voiced understanding stating he would like to have medication for tonight. Medication was sent to pt pharmacy.

## 2015-11-04 ENCOUNTER — Ambulatory Visit (INDEPENDENT_AMBULATORY_CARE_PROVIDER_SITE_OTHER): Payer: Medicare Other

## 2015-11-04 VITALS — BP 174/96 | HR 114 | Ht 70.0 in | Wt 188.9 lb

## 2015-11-04 DIAGNOSIS — C61 Malignant neoplasm of prostate: Secondary | ICD-10-CM

## 2015-11-04 NOTE — Progress Notes (Signed)
Fill and Pull Catheter Removal  Patient is present today for a catheter removal.  Patient was cleaned and prepped in a sterile fashion 130ml of sterile water/ saline was instilled into the bladder when the patient felt the urge to urinate. 20ml of water was then drained from the balloon.  A 16FR coude foley cath was removed from the bladder no complications were noted .  Patient as then given some time to void on their own.  Patient can void  123ml on their own after some time.  Patient tolerated well.  Preformed by: Toniann Fail, LPN   Follow up/ Additional notes: Reinforced with pt to drink plenty of fluids today and if not able to urinate call back by 3pm. Pt voiced understanding.   Blood pressure (!) 174/96, pulse (!) 114, height 5\' 10"  (1.778 m), weight 188 lb 14.4 oz (85.7 kg).

## 2015-11-08 ENCOUNTER — Telehealth: Payer: Self-pay

## 2015-11-08 ENCOUNTER — Ambulatory Visit (INDEPENDENT_AMBULATORY_CARE_PROVIDER_SITE_OTHER): Payer: Medicare Other

## 2015-11-08 VITALS — BP 169/97 | HR 88 | Ht 70.0 in | Wt 189.3 lb

## 2015-11-08 DIAGNOSIS — C61 Malignant neoplasm of prostate: Secondary | ICD-10-CM | POA: Diagnosis not present

## 2015-11-08 LAB — BLADDER SCAN AMB NON-IMAGING: Scan Result: 33

## 2015-11-08 NOTE — Telephone Encounter (Signed)
I'm fairly sure he can have the same problem with Rapaflo that he has with Flomax. In fact, Flomax causes the least orthostatic hypotension compared to medications in this class.  If he wants to stop by and pick up a sampling given to try, I recommend taking it right before he goes to bed in case it does cause dizziness.  Hollice Espy, MD

## 2015-11-08 NOTE — Progress Notes (Signed)
Pt came in today with c/o feeling of incomplete bladder emptying. PVR-33. Pt denied dysuria, n/v, f/c. Pt stated when he travels for work he does not drink a lot due to not wanting to stop. Encouraged pt to increase fluid intake. Pt voiced understanding.   Blood pressure (!) 169/97, pulse 88, height 5\' 10"  (1.778 m), weight 189 lb 4.8 oz (85.9 kg).

## 2015-11-08 NOTE — Telephone Encounter (Signed)
Pt came in today for bladder scan due to feeling of incomplete bladder emptying. Pt inquired about taking rapaflo. Please advise.

## 2015-11-08 NOTE — Telephone Encounter (Signed)
LMOM

## 2015-11-09 NOTE — Telephone Encounter (Signed)
Spoke with pt in reference to rapaflo. Pt stated that he would like to try the rapaflo. Made pt aware samples will be left up front. Pt voiced understanding.

## 2015-11-11 ENCOUNTER — Other Ambulatory Visit: Payer: Self-pay

## 2015-11-11 ENCOUNTER — Ambulatory Visit (INDEPENDENT_AMBULATORY_CARE_PROVIDER_SITE_OTHER): Payer: Medicare Other

## 2015-11-11 DIAGNOSIS — R972 Elevated prostate specific antigen [PSA]: Secondary | ICD-10-CM | POA: Diagnosis not present

## 2015-11-11 DIAGNOSIS — C61 Malignant neoplasm of prostate: Secondary | ICD-10-CM

## 2015-11-11 LAB — BLADDER SCAN AMB NON-IMAGING: Scan Result: 0

## 2015-11-11 MED ORDER — SILODOSIN 8 MG PO CAPS: 8.0000 mg | ORAL_CAPSULE | Freq: Every day | ORAL | 3 refills | Status: DC

## 2015-11-11 NOTE — Progress Notes (Signed)
Bladder Scan-0 Patient can void:  Performed By: Toniann Fail, LPN

## 2015-11-11 NOTE — Progress Notes (Signed)
Pt called stating he feels as though he is not emptying his bladder fully and wanted a script of rapaflo. Script was given. Offered for pt to come in for a bladder scan today on nurse schedule. Pt was added to nurse schedule for today.

## 2015-11-23 ENCOUNTER — Ambulatory Visit: Payer: Medicare Other

## 2015-11-30 ENCOUNTER — Other Ambulatory Visit: Payer: Self-pay | Admitting: *Deleted

## 2015-11-30 ENCOUNTER — Ambulatory Visit
Admission: RE | Admit: 2015-11-30 | Discharge: 2015-11-30 | Disposition: A | Discharge status: Home / Self Care | Payer: Medicare Other | Source: Ambulatory Visit | Attending: Radiation Oncology | Admitting: Radiation Oncology

## 2015-11-30 ENCOUNTER — Encounter: Payer: Self-pay | Admitting: Radiation Oncology

## 2015-11-30 VITALS — BP 168/91 | HR 67 | Temp 96.9°F | Resp 20 | Wt 190.8 lb

## 2015-11-30 DIAGNOSIS — C61 Malignant neoplasm of prostate: Secondary | ICD-10-CM

## 2015-11-30 DIAGNOSIS — K5900 Constipation, unspecified: Secondary | ICD-10-CM | POA: Insufficient documentation

## 2015-11-30 DIAGNOSIS — Z510 Encounter for antineoplastic radiation therapy: Secondary | ICD-10-CM | POA: Diagnosis not present

## 2015-11-30 NOTE — Progress Notes (Signed)
Radiation Oncology Follow up Note  Name: Tony Mendez   Date:   11/30/2015 MRN:  [id number] DOB: Dec 08, 1940    This 75 y.o. male presents to the clinic today for one-month follow-up status post I-125 interstitial implant for stage I prostate cancer.  REFERRING PROVIDER: Cletis Athens, MD  HPI: Patient is a 75 year old male now out 1 month having completed I-125 interstitial implant for adenocarcinoma the prostate presenting the PSA of 6.2 and Gleason 6 (3+3 tumor. He is seen today in routine follow-up is doing fairly well states he is having some pain upon sitting. Also some no nocturia 3 and some slight urgency and frequency. His bowels tends towards constipation..  COMPLICATIONS OF TREATMENT: none  FOLLOW UP COMPLIANCE: keeps appointments   PHYSICAL EXAM:  BP (!) 168/91   Pulse 67   Temp (!) 96.9 F (36.1 C)   Resp 20   Wt 190 lb 12.9 oz (86.5 kg)   BMI 27.38 kg/m  On rectal exam rectal sphincter tone is good. Prostate is smooth contracted without evidence of nodularity or mass. Sulcus is preserved bilaterally. No discrete nodularity is identified. No other rectal abnormalities are noted. Well-developed well-nourished patient in NAD. HEENT reveals PERLA, EOMI, discs not visualized.  Oral cavity is clear. No oral mucosal lesions are identified. Neck is clear without evidence of cervical or supraclavicular adenopathy. Lungs are clear to A&P. Cardiac examination is essentially unremarkable with regular rate and rhythm without murmur rub or thrill. Abdomen is benign with no organomegaly or masses noted. Motor sensory and DTR levels are equal and symmetric in the upper and lower extremities. Cranial nerves II through XII are grossly intact. Proprioception is intact. No peripheral adenopathy or edema is identified. No motor or sensory levels are noted. Crude visual fields are within normal range.  RADIOLOGY RESULTS: CT scan for quality assurance was performed showing excellent  placement of current sources.  PLAN: At the present time patient is doing well with side effects as expected I've assured him over the next month feels implant will continue to decrease in strength along with some of the symptoms he is having. I've asked him to start Senokot for his constipation. I've asked also to see him back in 3-4 months at which time I'll run a PSA level on him. Patient is to call sooner with any concerns.  I would like to take this opportunity to thank you for allowing me to participate in the care of your patient.Armstead Peaks., MD

## 2015-12-07 DIAGNOSIS — C61 Malignant neoplasm of prostate: Secondary | ICD-10-CM | POA: Diagnosis not present

## 2015-12-07 DIAGNOSIS — K5900 Constipation, unspecified: Secondary | ICD-10-CM | POA: Diagnosis not present

## 2015-12-07 DIAGNOSIS — Z510 Encounter for antineoplastic radiation therapy: Secondary | ICD-10-CM | POA: Diagnosis not present

## 2016-01-05 ENCOUNTER — Ambulatory Visit: Payer: Medicare Other

## 2016-01-06 ENCOUNTER — Encounter: Payer: Self-pay | Admitting: Urology

## 2016-01-06 ENCOUNTER — Ambulatory Visit: Payer: Medicare Other | Admitting: Urology

## 2016-01-06 VITALS — BP 165/83 | HR 63 | Wt 190.8 lb

## 2016-01-06 DIAGNOSIS — N400 Enlarged prostate without lower urinary tract symptoms: Secondary | ICD-10-CM

## 2016-01-06 DIAGNOSIS — C61 Malignant neoplasm of prostate: Secondary | ICD-10-CM

## 2016-01-06 NOTE — Progress Notes (Signed)
01/06/2016 10:55 AM   Tony Mendez 01/20/1940 [id number]  Referring provider: Cletis Athens, MD [address] [place], [place] [address]  Chief Complaint  Patient presents with  . Follow-up    prostate cancer     HPI: 75 year old male initially diagnosed with Gleason 3+3 involving 7 of 12 cores (high volume) in January 2017 with relative stable PSA since diagnosis. PSA was 6.2 at the time of diagnosis. Unfortunately, however, MRI shows an area which is concerning for high-grade carcinoma at the right base/mid gland.Marland Kitchen He underwent brachytherapy in October 2017. He has noted increased frequency of both urination and bowel movements since the seeds were placed. He feels his stream is intermittently weak. He does feel he empties his bladder. Has nocturia 3. He has tried Flomax in the past but could not tolerate the side effects of dizziness. He has a prescription for Rapaflo but has not wanted to try it at this point.   PMH: Past Medical History:  Diagnosis Date  . Arthritis   . BPH (benign prostatic hyperplasia)   . Cancer ([place])   . Elevated PSA 02/02/2015  . Gout   . Nocturia   . Prostate cancer ([place])   . Prostatitis     Surgical History: Past Surgical History:  Procedure Laterality Date  . APPENDECTOMY    . CYSTOSCOPY  11/01/2015   Procedure: CYSTOSCOPY;  Surgeon: Hollice Espy, MD;  Location: [facility] ORS;  Service: Urology;;  . RADIOACTIVE SEED IMPLANT N/A 11/01/2015   Procedure: RADIOACTIVE SEED IMPLANT/BRACHYTHERAPY IMPLANT;  Surgeon: Hollice Espy, MD;  Location: [facility] ORS;  Service: Urology;  Laterality: N/A;  . SKIN CANCER EXCISION    . TONSILLECTOMY      Home Medications:  Allergies as of 01/06/2016      Reactions   Flomax [tamsulosin Hcl] Other (See Comments)   Dizziness      Medication List       Accurate as of 01/06/16 10:55 AM. Always use your most recent med list.          aspirin 81 MG tablet Take 81 mg by mouth daily. Reported on 02/02/2015   CINNAMON PO Take by mouth daily.   ciprofloxacin 500 MG tablet Commonly known as:  CIPRO Take 1 tablet (500 mg total) by mouth 2 (two) times daily.   docusate sodium 100 MG capsule Commonly known as:  COLACE Take 100 mg by mouth 2 (two) times daily.   multivitamin tablet Take 1 tablet by mouth daily.       Allergies:  Allergies  Allergen Reactions  . Flomax [Tamsulosin Hcl] Other (See Comments)    Dizziness    Family History: Family History  Problem Relation Age of Onset  . Heart disease Father   . Kidney disease Father     one kidney removed  . Stroke Mother   . Prostate cancer Neg Hx     Social History:  reports that he quit smoking about 44 years ago. He has never used smokeless tobacco. He reports that he drinks about 3.0 oz of alcohol per week . He reports that he does not use drugs.  ROS: UROLOGY Frequent Urination?: Yes Hard to postpone urination?: No Burning/pain with urination?: No Get up at night to urinate?: Yes Leakage of urine?: No Urine stream starts and stops?: Yes Trouble starting stream?: Yes Do you have to strain to urinate?: Yes Blood in urine?: No Urinary tract infection?: No Sexually transmitted disease?: No Injury to kidneys or bladder?: No Painful intercourse?: No  Weak stream?: Yes Erection problems?: No Penile pain?: No  Gastrointestinal Nausea?: No Vomiting?: No Indigestion/heartburn?: No Diarrhea?: No Constipation?: No  Constitutional Fever: No Night sweats?: No Weight loss?: No Fatigue?: No  Skin Skin rash/lesions?: No Itching?: No  Eyes Blurred vision?: No Double vision?: No  Ears/Nose/Throat Sore throat?: No Sinus problems?: No  Hematologic/Lymphatic Swollen glands?: No Easy bruising?: No  Cardiovascular Leg swelling?: No Chest pain?: No  Respiratory Cough?: No Shortness of breath?: No  Endocrine Excessive thirst?: No  Musculoskeletal Back pain?: No Joint pain?:  No  Neurological Headaches?: No Dizziness?: No  Psychologic Depression?: No Anxiety?: No  Physical Exam: BP (!) 165/83   Pulse 63   Wt 190 lb 12.8 oz (86.5 kg)   BMI 27.38 kg/m   Constitutional:  Alert and oriented, No acute distress. HEENT: [place] AT, moist mucus membranes.  Trachea midline, no masses. Cardiovascular: No clubbing, cyanosis, or edema. Respiratory: Normal respiratory effort, no increased work of breathing. GI: Abdomen is soft, nontender, nondistended, no abdominal masses GU: No CVA tenderness.  Skin: No rashes, bruises or suspicious lesions. Lymph: No cervical or inguinal adenopathy. Neurologic: Grossly intact, no focal deficits, moving all 4 extremities. Psychiatric: Normal mood and affect.  Laboratory Data: Lab Results  Component Value Date   WBC 6.9 10/18/2015   HGB 15.5 10/18/2015   HCT 44.0 10/18/2015   MCV 96.7 10/18/2015   PLT 242 10/18/2015    Lab Results  Component Value Date   CREATININE 0.96 07/07/2015    Lab Results  Component Value Date   PSA 2.4 05/20/2012    No results found for: TESTOSTERONE  No results found for: HGBA1C  Urinalysis No results found for: COLORURINE, APPEARANCEUR, LABSPEC, PHURINE, GLUCOSEU, HGBUR, BILIRUBINUR, KETONESUR, PROTEINUR, UROBILINOGEN, NITRITE, LEUKOCYTESUR  Assessment & Plan:    1. Prostate cancer As the patient is scheduled of his PSA drawn by radiation oncology in approximately 3 months, I'll have him follow-up with [place] in 6 months the PSA prior to the start trending his PSA status post brachytherapy.  2. BPH The patient finds his symptoms,. He is not interested in taking the Rapaflo which he has a prescription for.  Return in about 6 months (around 07/06/2016) for PSA prior.  Nickie Retort, MD  [facility] [address], [place], [place] [address] [phone]

## 2016-01-07 DIAGNOSIS — N400 Enlarged prostate without lower urinary tract symptoms: Secondary | ICD-10-CM | POA: Diagnosis not present

## 2016-01-07 DIAGNOSIS — L309 Dermatitis, unspecified: Secondary | ICD-10-CM | POA: Diagnosis not present

## 2016-01-07 DIAGNOSIS — K210 Gastro-esophageal reflux disease with esophagitis: Secondary | ICD-10-CM | POA: Diagnosis not present

## 2016-01-07 DIAGNOSIS — R0789 Other chest pain: Secondary | ICD-10-CM | POA: Diagnosis not present

## 2016-02-02 ENCOUNTER — Ambulatory Visit: Payer: Medicare Other | Admitting: Urology

## 2016-02-04 ENCOUNTER — Ambulatory Visit: Payer: Medicare Other | Admitting: Urology

## 2016-02-24 DIAGNOSIS — L309 Dermatitis, unspecified: Secondary | ICD-10-CM | POA: Diagnosis not present

## 2016-02-24 DIAGNOSIS — R0602 Shortness of breath: Secondary | ICD-10-CM | POA: Diagnosis not present

## 2016-02-24 DIAGNOSIS — R079 Chest pain, unspecified: Secondary | ICD-10-CM | POA: Diagnosis not present

## 2016-02-24 DIAGNOSIS — R0789 Other chest pain: Secondary | ICD-10-CM | POA: Diagnosis not present

## 2016-02-28 ENCOUNTER — Telehealth: Payer: Self-pay

## 2016-02-28 NOTE — Telephone Encounter (Signed)
Spoke with pt in reference to colonoscopy and gold seeds. Made aware Dr. Baruch Gouty will have the final word. Pt voiced understanding.

## 2016-02-28 NOTE — Telephone Encounter (Signed)
Pt called stating he's been having a lot of issues w/ constipation x 80mths. His PCP recommended him getting a colonoscopy. The pt was scheduled for the colonoscopy in April. He called to the office today w/ concerns about getting a colonoscopy after just recently getting brachytherapy x 4 mths ago. I also recommended the pt to contact  Dr.Crystal office as well. Please advise

## 2016-02-28 NOTE — Telephone Encounter (Signed)
I am not aware of any contraindications for this. The seeds were placed transperineal and not transrectally so the seeds should cause an issue. Ultimately, Dr. Baruch Gouty should give a definitive word on this.  Hollice Espy, MD

## 2016-03-02 ENCOUNTER — Other Ambulatory Visit: Payer: Self-pay

## 2016-03-07 ENCOUNTER — Telehealth: Payer: Self-pay

## 2016-03-07 NOTE — Telephone Encounter (Signed)
Gastroenterology Pre-Procedure Review  Request Date:  Requesting Physician: Dr.   PATIENT REVIEW QUESTIONS: The patient responded to the following health history questions as indicated:    1. Are you having any GI issues? yes (constipation) 2. Do you have a personal history of Polyps? yes (x 1) 3. Do you have a family history of Colon Cancer or Polyps? no 4. Diabetes Mellitus? no 5. Joint replacements in the past 12 months?no 6. Major health problems in the past 3 months?no 7. Any artificial heart valves, MVP, or defibrillator?no    MEDICATIONS & ALLERGIES:    Patient reports the following regarding taking any anticoagulation/antiplatelet therapy:   Plavix, Coumadin, Eliquis, Xarelto, Lovenox, Pradaxa, Brilinta, or Effient? no Aspirin? yes (ASA 81mg )  Patient confirms/reports the following medications:  Current Outpatient Prescriptions  Medication Sig Dispense Refill  . aspirin 81 MG tablet Take 81 mg by mouth daily. Reported on 02/02/2015    . CINNAMON PO Take by mouth daily.    . ciprofloxacin (CIPRO) 500 MG tablet Take 1 tablet (500 mg total) by mouth 2 (two) times daily. 6 tablet 0  . docusate sodium (COLACE) 100 MG capsule Take 100 mg by mouth 2 (two) times daily.    . Multiple Vitamin (MULTIVITAMIN) tablet Take 1 tablet by mouth daily.     No current facility-administered medications for this visit.    Facility-Administered Medications Ordered in Other Visits  Medication Dose Route Frequency Provider Last Rate Last Dose  . ceFAZolin (ANCEF) IVPB 1 g/50 mL premix  1 g Intravenous 30 min Pre-Op Hollice Espy, MD        Patient confirms/reports the following allergies:  Allergies  Allergen Reactions  . Flomax [Tamsulosin Hcl] Other (See Comments)    Dizziness    No orders of the defined types were placed in this encounter.   AUTHORIZATION INFORMATION Primary Insurance: 1D#: Group #:  Secondary Insurance: 1D#: Group #:  SCHEDULE INFORMATION: Date:  03/20/16 Time: Location: [place]

## 2016-03-10 ENCOUNTER — Encounter: Payer: Self-pay | Admitting: *Deleted

## 2016-03-10 ENCOUNTER — Other Ambulatory Visit: Payer: Self-pay | Admitting: *Deleted

## 2016-03-15 ENCOUNTER — Encounter: Payer: Self-pay | Admitting: *Deleted

## 2016-03-17 NOTE — Discharge Instructions (Signed)

## 2016-03-20 ENCOUNTER — Ambulatory Visit: Payer: Medicare Other | Admitting: Anesthesiology

## 2016-03-20 ENCOUNTER — Encounter
Admission: RE | Disposition: A | Discharge status: Home / Self Care | Payer: Self-pay | Source: Ambulatory Visit | Attending: Gastroenterology

## 2016-03-20 ENCOUNTER — Ambulatory Visit
Admission: RE | Admit: 2016-03-20 | Discharge: 2016-03-20 | Disposition: A | Discharge status: Home / Self Care | Payer: Medicare Other | Source: Ambulatory Visit | Attending: Gastroenterology | Admitting: Gastroenterology

## 2016-03-20 DIAGNOSIS — Z1211 Encounter for screening for malignant neoplasm of colon: Secondary | ICD-10-CM | POA: Diagnosis not present

## 2016-03-20 DIAGNOSIS — K219 Gastro-esophageal reflux disease without esophagitis: Secondary | ICD-10-CM | POA: Insufficient documentation

## 2016-03-20 DIAGNOSIS — Z85828 Personal history of other malignant neoplasm of skin: Secondary | ICD-10-CM | POA: Diagnosis not present

## 2016-03-20 DIAGNOSIS — Z87891 Personal history of nicotine dependence: Secondary | ICD-10-CM | POA: Insufficient documentation

## 2016-03-20 DIAGNOSIS — K5730 Diverticulosis of large intestine without perforation or abscess without bleeding: Secondary | ICD-10-CM | POA: Diagnosis not present

## 2016-03-20 DIAGNOSIS — M109 Gout, unspecified: Secondary | ICD-10-CM | POA: Diagnosis not present

## 2016-03-20 DIAGNOSIS — Z860100 Personal history of colon polyps, unspecified: Secondary | ICD-10-CM

## 2016-03-20 DIAGNOSIS — Z79899 Other long term (current) drug therapy: Secondary | ICD-10-CM | POA: Insufficient documentation

## 2016-03-20 DIAGNOSIS — Z8546 Personal history of malignant neoplasm of prostate: Secondary | ICD-10-CM | POA: Insufficient documentation

## 2016-03-20 DIAGNOSIS — K635 Polyp of colon: Secondary | ICD-10-CM | POA: Diagnosis not present

## 2016-03-20 DIAGNOSIS — K621 Rectal polyp: Secondary | ICD-10-CM | POA: Diagnosis not present

## 2016-03-20 DIAGNOSIS — N400 Enlarged prostate without lower urinary tract symptoms: Secondary | ICD-10-CM | POA: Diagnosis not present

## 2016-03-20 DIAGNOSIS — K641 Second degree hemorrhoids: Secondary | ICD-10-CM | POA: Diagnosis not present

## 2016-03-20 DIAGNOSIS — Z7982 Long term (current) use of aspirin: Secondary | ICD-10-CM | POA: Insufficient documentation

## 2016-03-20 DIAGNOSIS — D123 Benign neoplasm of transverse colon: Secondary | ICD-10-CM | POA: Diagnosis not present

## 2016-03-20 HISTORY — PX: COLONOSCOPY WITH PROPOFOL: SHX5780

## 2016-03-20 HISTORY — PX: POLYPECTOMY: SHX5525

## 2016-03-20 HISTORY — DX: Gastro-esophageal reflux disease without esophagitis: K21.9

## 2016-03-20 HISTORY — DX: Motion sickness, initial encounter: T75.3XXA

## 2016-03-20 SURGERY — COLONOSCOPY WITH PROPOFOL
Anesthesia: Monitor Anesthesia Care | Wound class: Contaminated

## 2016-03-20 MED ORDER — PROPOFOL 10 MG/ML IV BOLUS
INTRAVENOUS | Status: DC | PRN
Start: 2016-03-20 — End: 2016-03-20
  Administered 2016-03-20: 40 mg via INTRAVENOUS
  Administered 2016-03-20: 120 mg via INTRAVENOUS
  Administered 2016-03-20: 30 mg via INTRAVENOUS
  Administered 2016-03-20: 40 mg via INTRAVENOUS

## 2016-03-20 MED ORDER — SIMETHICONE 40 MG/0.6ML PO SUSP
ORAL | Status: DC | PRN
  Administered 2016-03-20: 09:00:00

## 2016-03-20 MED ORDER — LACTATED RINGERS IV SOLN
INTRAVENOUS | Status: DC
  Administered 2016-03-20: 08:00:00 via INTRAVENOUS

## 2016-03-20 MED ORDER — LIDOCAINE HCL (CARDIAC) 20 MG/ML IV SOLN
INTRAVENOUS | Status: DC | PRN
  Administered 2016-03-20: 50 mg via INTRAVENOUS

## 2016-03-20 SURGICAL SUPPLY — 23 items
CANISTER SUCT 1200ML W/VALVE (MISCELLANEOUS) ×4 IMPLANT
CLIP HMST 235XBRD CATH ROT (MISCELLANEOUS) IMPLANT
CLIP RESOLUTION 360 11X235 (MISCELLANEOUS)
FCP ESCP3.2XJMB 240X2.8X (MISCELLANEOUS)
FORCEPS BIOP RAD 4 LRG CAP 4 (CUTTING FORCEPS) ×4 IMPLANT
FORCEPS BIOP RJ4 240 W/NDL (MISCELLANEOUS)
FORCEPS ESCP3.2XJMB 240X2.8X (MISCELLANEOUS) IMPLANT
GOWN CVR UNV OPN BCK APRN NK (MISCELLANEOUS) ×4 IMPLANT
GOWN ISOL THUMB LOOP REG UNIV (MISCELLANEOUS) ×4
INJECTOR VARIJECT VIN23 (MISCELLANEOUS) IMPLANT
KIT DEFENDO VALVE AND CONN (KITS) IMPLANT
KIT ENDO PROCEDURE OLY (KITS) ×4 IMPLANT
MARKER SPOT ENDO TATTOO 5ML (MISCELLANEOUS) IMPLANT
PAD GROUND ADULT SPLIT (MISCELLANEOUS) IMPLANT
PROBE APC STR FIRE (PROBE) IMPLANT
RETRIEVER NET ROTH 2.5X230 LF (MISCELLANEOUS) IMPLANT
SNARE SHORT THROW 13M SML OVAL (MISCELLANEOUS) ×4 IMPLANT
SNARE SHORT THROW 30M LRG OVAL (MISCELLANEOUS) IMPLANT
SNARE SNG USE RND 15MM (INSTRUMENTS) IMPLANT
SPOT EX ENDOSCOPIC TATTOO (MISCELLANEOUS)
TRAP ETRAP POLY (MISCELLANEOUS) ×4 IMPLANT
VARIJECT INJECTOR VIN23 (MISCELLANEOUS)
WATER STERILE IRR 250ML POUR (IV SOLUTION) ×4 IMPLANT

## 2016-03-20 NOTE — Transfer of Care (Signed)
Immediate Anesthesia Transfer of Care Note  Patient: Tony Mendez  Procedure(s) Performed: Procedure(s): COLONOSCOPY WITH PROPOFOL (N/A) POLYPECTOMY  Patient Location: PACU  Anesthesia Type: MAC  Level of Consciousness: awake, alert  and patient cooperative  Airway and Oxygen Therapy: Patient Spontanous Breathing and Patient connected to supplemental oxygen  Post-op Assessment: Post-op Vital signs reviewed, Patient's Cardiovascular Status Stable, Respiratory Function Stable, Patent Airway and No signs of Nausea or vomiting  Post-op Vital Signs: Reviewed and stable  Complications: No apparent anesthesia complications

## 2016-03-20 NOTE — Anesthesia Procedure Notes (Signed)
Procedure Name: MAC Date/Time: 03/20/2016 8:30 AM Performed by: Cameron Ali Pre-anesthesia Checklist: Patient identified, Emergency Drugs available, Suction available, Timeout performed and Patient being monitored Patient Re-evaluated:Patient Re-evaluated prior to inductionOxygen Delivery Method: Nasal cannula Placement Confirmation: positive ETCO2

## 2016-03-20 NOTE — H&P (Signed)
Lucilla Lame, MD [facility] [address], [place], [place] [address] Phone: [phone] Fax : [phone]  Primary Care Physician:  Cletis Athens, MD Primary Gastroenterologist:  Dr. Allen Norris  Pre-Procedure History & Physical: HPI:  Tony Mendez is a 76 y.o. male is here for an colonoscopy.   Past Medical History:  Diagnosis Date  . Arthritis   . BPH (benign prostatic hyperplasia)   . Cancer ([place])   . Elevated PSA 02/02/2015  . GERD (gastroesophageal reflux disease)   . Gout   . Motion sickness   . Nocturia   . Prostate cancer ([place])   . Prostatitis     Past Surgical History:  Procedure Laterality Date  . APPENDECTOMY    . CYSTOSCOPY  11/01/2015   Procedure: CYSTOSCOPY;  Surgeon: Hollice Espy, MD;  Location: [facility] ORS;  Service: Urology;;  . RADIOACTIVE SEED IMPLANT N/A 11/01/2015   Procedure: RADIOACTIVE SEED IMPLANT/BRACHYTHERAPY IMPLANT;  Surgeon: Hollice Espy, MD;  Location: [facility] ORS;  Service: Urology;  Laterality: N/A;  . SKIN CANCER EXCISION    . TONSILLECTOMY      Prior to Admission medications   Medication Sig Start Date End Date Taking? Authorizing Provider  aspirin 81 MG tablet Take 81 mg by mouth daily. Reported on 02/02/2015   Yes Historical Provider, MD  CINNAMON PO Take by mouth daily.   Yes Historical Provider, MD  docusate sodium (COLACE) 100 MG capsule Take 100 mg by mouth 2 (two) times daily.   Yes Historical Provider, MD  GARLIC PO Take by mouth.   Yes Historical Provider, MD  Multiple Vitamin (MULTIVITAMIN) tablet Take 1 tablet by mouth daily.   Yes Historical Provider, MD    Allergies as of 03/02/2016 - Review Complete 01/06/2016  Allergen Reaction Noted  . Flomax [tamsulosin hcl] Other (See Comments) 12/21/2014    Family History  Problem Relation Age of Onset  . Heart disease Father   . Kidney disease Father     one kidney removed  . Stroke Mother   . Prostate cancer Neg Hx     Social History   Social History  . Marital  status: Divorced    Spouse name: N/A  . Number of children: N/A  . Years of education: N/A   Occupational History  . Not on file.   Social History Main Topics  . Smoking status: Former Smoker    Quit date: 12/21/1971  . Smokeless tobacco: Never Used  . Alcohol use 12.6 oz/week    7 Glasses of wine, 14 Cans of beer per week     Comment: 23 oz a day  . Drug use: No  . Sexual activity: Not on file   Other Topics Concern  . Not on file   Social History Narrative  . No narrative on file    Review of Systems: See HPI, otherwise negative ROS  Physical Exam: BP (!) 155/83   Pulse 84   Temp 98.1 F (36.7 C) (Tympanic)   Resp 18   Ht 5\' 10"  (1.778 m)   Wt 184 lb (83.5 kg)   SpO2 97%   BMI 26.40 kg/m  General:   Alert,  pleasant and cooperative in NAD Head:  Normocephalic and atraumatic. Neck:  Supple; no masses or thyromegaly. Lungs:  Clear throughout to auscultation.    Heart:  Regular rate and rhythm. Abdomen:  Soft, nontender and nondistended. Normal bowel sounds, without guarding, and without rebound.   Neurologic:  Alert and  oriented x4;  grossly normal neurologically.  Impression/Plan: Tony Mendez is here for an colonoscopy to be performed for history of colon polyps  Risks, benefits, limitations, and alternatives regarding  colonoscopy have been reviewed with the patient.  Questions have been answered.  All parties agreeable.   Lucilla Lame, MD  03/20/2016, 8:21 AM

## 2016-03-20 NOTE — Op Note (Signed)
[facility] Gastroenterology Patient Name: Tony Mendez Procedure Date: 03/20/2016 8:17 AM MRN: [id number] Account #: [id number] Date of Birth: 07/06/1940 Admit Type: Outpatient Age: 76 Room: [facility] OR ROOM 01 Gender: Male Note Status: Finalized Procedure:            Colonoscopy Indications:          High risk colon cancer surveillance: Personal history                        of colonic polyps Providers:            Lucilla Lame MD, MD Referring MD:         Cletis Athens, MD (Referring MD) Medicines:            Propofol per Anesthesia Complications:        No immediate complications. Procedure:            Pre-Anesthesia Assessment:                       - Prior to the procedure, a History and Physical was                        performed, and patient medications and allergies were                        reviewed. The patient's tolerance of previous                        anesthesia was also reviewed. The risks and benefits of                        the procedure and the sedation options and risks were                        discussed with the patient. All questions were                        answered, and informed consent was obtained. Prior                        Anticoagulants: The patient has taken no previous                        anticoagulant or antiplatelet agents. ASA Grade                        Assessment: II - A patient with mild systemic disease.                        After reviewing the risks and benefits, the patient was                        deemed in satisfactory condition to undergo the                        procedure.                       After obtaining informed consent, the colonoscope was  passed under direct vision. Throughout the procedure,                        the patient's blood pressure, pulse, and oxygen                        saturations were monitored continuously. The [place] (S#: [id number]) was introduced through                        the anus and advanced to the the cecum, identified by                        appendiceal orifice and ileocecal valve. The                        colonoscopy was performed without difficulty. The                        patient tolerated the procedure well. The quality of                        the bowel preparation was excellent. Findings:      The perianal and digital rectal examinations were normal.      A 6 mm polyp was found in the transverse colon. The polyp was sessile.       The polyp was removed with a cold snare. Resection and retrieval were       complete.      Two sessile polyps were found in the rectum. The polyps were 2 to 3 mm       in size. These polyps were removed with a cold biopsy forceps. Resection       and retrieval were complete.      A few small-mouthed diverticula were found in the sigmoid colon.      Non-bleeding internal hemorrhoids were found during retroflexion. The       hemorrhoids were Grade II (internal hemorrhoids that prolapse but reduce       spontaneously). Impression:           - One 6 mm polyp in the transverse colon, removed with                        a cold snare. Resected and retrieved.                       - Two 2 to 3 mm polyps in the rectum, removed with a                        cold biopsy forceps. Resected and retrieved.                       - Diverticulosis in the sigmoid colon.                       - Non-bleeding internal hemorrhoids. Recommendation:       - Discharge patient to home.                       - Resume previous diet.                       -  Continue present medications.                       - Repeat colonoscopy in 5 years for surveillance. Procedure Code(s):    --- Professional ---                       [phone], Colonoscopy, flexible; with removal of tumor(s),                        polyp(s), or other lesion(s) by snare technique                        45380, 63, Colonoscopy, flexible; with biopsy, single                        or multiple Diagnosis Code(s):    --- Professional ---                       Z86.010, Personal history of colonic polyps                       D12.3, Benign neoplasm of transverse colon (hepatic                        flexure or splenic flexure) CPT copyright 2016 American Medical Association. All rights reserved. The codes documented in this report are preliminary and upon coder review may  be revised to meet current compliance requirements. Lucilla Lame MD, MD 03/20/2016 8:48:41 AM This report has been signed electronically. Number of Addenda: 0 Note Initiated On: 03/20/2016 8:17 AM Scope Withdrawal Time: 0 hours 8 minutes 2 seconds  Total Procedure Duration: 0 hours 10 minutes 6 seconds       [facility]

## 2016-03-20 NOTE — Anesthesia Preprocedure Evaluation (Signed)
Anesthesia Evaluation  Patient identified by MRN, date of birth, ID band Patient awake    Reviewed: Allergy & Precautions, H&P , NPO status , Patient's Chart, lab work & pertinent test results  Airway Mallampati: II  TM Distance: >3 FB Neck ROM: full    Dental no notable dental hx.    Pulmonary former smoker,    Pulmonary exam normal        Cardiovascular negative cardio ROS Normal cardiovascular exam     Neuro/Psych    GI/Hepatic Neg liver ROS, Medicated,  Endo/Other  negative endocrine ROS  Renal/GU negative Renal ROS     Musculoskeletal   Abdominal   Peds  Hematology negative hematology ROS (+)   Anesthesia Other Findings   Reproductive/Obstetrics                             Anesthesia Physical Anesthesia Plan  ASA: II  Anesthesia Plan: MAC   Post-op Pain Management:    Induction:   Airway Management Planned:   Additional Equipment:   Intra-op Plan:   Post-operative Plan:   Informed Consent: I have reviewed the patients History and Physical, chart, labs and discussed the procedure including the risks, benefits and alternatives for the proposed anesthesia with the patient or authorized representative who has indicated his/her understanding and acceptance.     Plan Discussed with:   Anesthesia Plan Comments:         Anesthesia Quick Evaluation

## 2016-03-20 NOTE — Anesthesia Postprocedure Evaluation (Signed)
Anesthesia Post Note  Patient: Tony Mendez  Procedure(s) Performed: Procedure(s) (LRB): COLONOSCOPY WITH PROPOFOL (N/A) POLYPECTOMY  Patient location during evaluation: PACU Anesthesia Type: MAC Level of consciousness: awake Pain management: pain level controlled Vital Signs Assessment: post-procedure vital signs reviewed and stable Respiratory status: spontaneous breathing Cardiovascular status: blood pressure returned to baseline Postop Assessment: no headache Anesthetic complications: no    Jaci Standard, III,  Jeven Topper D

## 2016-03-21 ENCOUNTER — Encounter: Payer: Self-pay | Admitting: Gastroenterology

## 2016-03-23 ENCOUNTER — Encounter: Payer: Self-pay | Admitting: Gastroenterology

## 2016-04-03 ENCOUNTER — Other Ambulatory Visit: Payer: Self-pay | Admitting: *Deleted

## 2016-04-07 ENCOUNTER — Inpatient Hospital Stay: Payer: Medicare Other | Attending: Radiation Oncology

## 2016-04-07 DIAGNOSIS — C61 Malignant neoplasm of prostate: Secondary | ICD-10-CM | POA: Insufficient documentation

## 2016-04-07 LAB — PSA: PSA: 1.72 ng/mL (ref 0.00–4.00)

## 2016-04-11 ENCOUNTER — Encounter: Payer: Self-pay | Admitting: Radiation Oncology

## 2016-04-11 ENCOUNTER — Other Ambulatory Visit: Payer: Self-pay | Admitting: *Deleted

## 2016-04-11 ENCOUNTER — Ambulatory Visit
Admission: RE | Admit: 2016-04-11 | Discharge: 2016-04-11 | Disposition: A | Discharge status: Home / Self Care | Payer: Medicare Other | Source: Ambulatory Visit | Attending: Radiation Oncology | Admitting: Radiation Oncology

## 2016-04-11 VITALS — BP 158/82 | HR 63 | Temp 97.6°F | Wt 187.8 lb

## 2016-04-11 DIAGNOSIS — Z923 Personal history of irradiation: Secondary | ICD-10-CM | POA: Diagnosis not present

## 2016-04-11 DIAGNOSIS — C61 Malignant neoplasm of prostate: Secondary | ICD-10-CM

## 2016-04-11 MED ORDER — HYDROCORTISONE ACETATE 25 MG RE SUPP: 25.0000 mg | Freq: Two times a day (BID) | RECTAL | 0 refills | Status: DC | PRN

## 2016-04-11 NOTE — Progress Notes (Signed)
Radiation Oncology Follow up Note  Name: Tony Mendez   Date:   04/11/2016 MRN:  [id number] DOB: [birth year]    This 76 y.o. male presents to the clinic today for 5 month follow-up status post I-125 interstitial implant for prostate cancer.  REFERRING PROVIDER: Cletis Athens, MD  HPI: Patient is a 76 year old male now out 5 months having completed I-125 interstitial implant for a Gleason 6 adenocarcinoma the prostate presenting the PSA of 6.2. Seen today in routine follow-up he is doing well. Is having little problems with hemorrhoids. Specifically denies diarrhea dysuria or any other GI/GU complaints. Last PSA performed this week was 2.70.  COMPLICATIONS OF TREATMENT: none  FOLLOW UP COMPLIANCE: keeps appointments   PHYSICAL EXAM:  BP (!) 158/82   Pulse 63   Temp 97.6 F (36.4 C)   Wt 187 lb 13.3 oz (85.2 kg)   BMI 26.95 kg/m  Well-developed well-nourished patient in NAD. HEENT reveals PERLA, EOMI, discs not visualized.  Oral cavity is clear. No oral mucosal lesions are identified. Neck is clear without evidence of cervical or supraclavicular adenopathy. Lungs are clear to A&P. Cardiac examination is essentially unremarkable with regular rate and rhythm without murmur rub or thrill. Abdomen is benign with no organomegaly or masses noted. Motor sensory and DTR levels are equal and symmetric in the upper and lower extremities. Cranial nerves II through XII are grossly intact. Proprioception is intact. No peripheral adenopathy or edema is identified. No motor or sensory levels are noted. Crude visual fields are within normal range.  RADIOLOGY RESULTS: No current films for review  PLAN: Present time patient is doing well with good biochemical control of his prostate cancer. I'm please was overall progress. I've asked to see him back in 6 months for follow-up and then will see him once your visits. Will obtain a PSA prior to next visit. I've also prescribe some 2% cortisone rectal  suppositories for his hemorrhoids. Patient knows to call with any concerns.  I would like to take this opportunity to thank you for allowing me to participate in the care of your patient.Armstead Peaks., MD

## 2016-04-14 ENCOUNTER — Ambulatory Visit: Payer: Self-pay | Admitting: Radiation Oncology

## 2016-06-08 DIAGNOSIS — M25512 Pain in left shoulder: Secondary | ICD-10-CM | POA: Diagnosis not present

## 2016-06-08 DIAGNOSIS — C61 Malignant neoplasm of prostate: Secondary | ICD-10-CM | POA: Diagnosis not present

## 2016-06-08 DIAGNOSIS — R0789 Other chest pain: Secondary | ICD-10-CM | POA: Diagnosis not present

## 2016-06-08 DIAGNOSIS — Z1389 Encounter for screening for other disorder: Secondary | ICD-10-CM | POA: Diagnosis not present

## 2016-07-05 ENCOUNTER — Other Ambulatory Visit: Payer: Medicare Other

## 2016-07-05 DIAGNOSIS — C61 Malignant neoplasm of prostate: Secondary | ICD-10-CM | POA: Diagnosis not present

## 2016-07-06 LAB — PSA: PROSTATE SPECIFIC AG, SERUM: 1.8 ng/mL (ref 0.0–4.0)

## 2016-07-07 ENCOUNTER — Ambulatory Visit: Payer: Medicare Other | Admitting: Urology

## 2016-07-12 ENCOUNTER — Ambulatory Visit (INDEPENDENT_AMBULATORY_CARE_PROVIDER_SITE_OTHER): Payer: Medicare Other | Admitting: Urology

## 2016-07-12 ENCOUNTER — Encounter: Payer: Self-pay | Admitting: Urology

## 2016-07-12 VITALS — BP 144/67 | HR 78 | Ht 70.0 in | Wt 182.0 lb

## 2016-07-12 DIAGNOSIS — C61 Malignant neoplasm of prostate: Secondary | ICD-10-CM | POA: Diagnosis not present

## 2016-07-12 NOTE — Progress Notes (Signed)
07/12/2016 5:33 PM   Tony Mendez 1940/02/18 [id number]  Referring provider: Cletis Athens, MD [address] [place],  [address]  Chief Complaint  Patient presents with  . Prostate Cancer    48month    HPI: 76 yo M with prostate cancer s/p brachytherapy on 11/01/15.   He was initally dx with Gleason 3+3 prostate cancer previously on active surveillance who underwent prostate MRI which showed a concerning area at the right base for high grade carcinoma.  Due to concern for higher risk lesion, he elected to procede with brachytherapy.    PSA trend as below.  Most recent PSA 1.8 on 06/2016.  He did have severe constipation following the procedure which has resolved.  He now uses Senakot as needed  Which works well.  Today, he denies gross hematuria, dysuria, or UTIs.  He does have occasional urgency.  Nocturia x3.  Adequate stream but can be variable.  Feels like he is able to empty bladder.  Overall, satisfied.    He did have issues with perineal pain after the procedure which is slowly improving.  He finds that if he sits on a hard surface for too long, he may have sensation of needing to urinate or discomfort.  He has been sitting on a dounut in his care.  Overall, this seems to be going away.  He is not currently sexually active.  PMH: Past Medical History:  Diagnosis Date  . Arthritis   . BPH (benign prostatic hyperplasia)   . Cancer ([place])   . Elevated PSA 02/02/2015  . GERD (gastroesophageal reflux disease)   . Gout   . Motion sickness   . Nocturia   . Prostate cancer ([place])   . Prostatitis     Surgical History: Past Surgical History:  Procedure Laterality Date  . APPENDECTOMY    . COLONOSCOPY WITH PROPOFOL N/A 03/20/2016   Procedure: COLONOSCOPY WITH PROPOFOL;  Surgeon: Lucilla Lame, MD;  Location: [facility];  Service: Endoscopy;  Laterality: N/A;  . CYSTOSCOPY  11/01/2015   Procedure: CYSTOSCOPY;  Surgeon: Hollice Espy, MD;  Location: [facility];   Service: Urology;;  . POLYPECTOMY  03/20/2016   Procedure: POLYPECTOMY;  Surgeon: Lucilla Lame, MD;  Location: [facility];  Service: Endoscopy;;  . RADIOACTIVE SEED IMPLANT N/A 11/01/2015   Procedure: RADIOACTIVE SEED IMPLANT/BRACHYTHERAPY IMPLANT;  Surgeon: Hollice Espy, MD;  Location: [facility];  Service: Urology;  Laterality: N/A;  . SKIN CANCER EXCISION    . TONSILLECTOMY      Home Medications:  Allergies as of 07/12/2016      Reactions   Flomax [tamsulosin Hcl] Other (See Comments)   Dizziness      Medication List       Accurate as of 07/12/16 11:59 PM. Always use your most recent med list.          aspirin 81 MG tablet Take 81 mg by mouth daily. Reported on 02/02/2015   CINNAMON PO Take by mouth daily.   docusate sodium 100 MG capsule Commonly known as:  COLACE Take 100 mg by mouth 2 (two) times daily.   GARLIC PO Take by mouth.   lisinopril 20 MG tablet Commonly known as:  PRINIVIL,ZESTRIL Take 20 mg by mouth daily.   multivitamin tablet Take 1 tablet by mouth daily.       Allergies:  Allergies  Allergen Reactions  . Flomax [Tamsulosin Hcl] Other (See Comments)    Dizziness    Family History: Family History  Problem  Relation Age of Onset  . Heart disease Father   . Kidney disease Father        one kidney removed  . Stroke Mother   . Prostate cancer Neg Hx     Social History:  reports that he quit smoking about 44 years ago. He has never used smokeless tobacco. He reports that he drinks about 12.6 oz of alcohol per week . He reports that he does not use drugs.  ROS: UROLOGY Frequent Urination?: Yes Hard to postpone urination?: No Burning/pain with urination?: No Get up at night to urinate?: Yes Leakage of urine?: No Urine stream starts and stops?: No Trouble starting stream?: No Do you have to strain to urinate?: No Blood in urine?: No Urinary tract infection?: No Sexually transmitted disease?: No Injury to kidneys or bladder?:  No Painful intercourse?: No Weak stream?: Yes Erection problems?: No Penile pain?: No  Gastrointestinal Nausea?: No Vomiting?: No Indigestion/heartburn?: No Diarrhea?: No Constipation?: Yes  Constitutional Fever: No Night sweats?: No Weight loss?: Yes Fatigue?: No  Skin Skin rash/lesions?: No Itching?: No  Eyes Blurred vision?: No Double vision?: No  Ears/Nose/Throat Sore throat?: No Sinus problems?: No  Hematologic/Lymphatic Swollen glands?: No Easy bruising?: No  Cardiovascular Leg swelling?: No Chest pain?: No  Respiratory Cough?: No Shortness of breath?: No  Endocrine Excessive thirst?: No  Musculoskeletal Back pain?: No Joint pain?: No  Neurological Headaches?: No Dizziness?: No  Psychologic Depression?: No Anxiety?: No  Physical Exam: BP (!) 144/67   Pulse 78   Ht 5\' 10"  (1.778 m)   Wt 182 lb (82.6 kg)   BMI 26.11 kg/m   Constitutional:  Alert and oriented, No acute distress. HEENT: [place] AT, moist mucus membranes.  Trachea midline, no masses. Cardiovascular: No clubbing, cyanosis, or edema. Respiratory: Normal respiratory effort, no increased work of breathing. GI: Abdomen is soft, nontender, nondistended, no abdominal masses GU: No CVA tenderness.  Skin: No rashes, bruises or suspicious lesions. Neurologic: Grossly intact, no focal deficits, moving all 4 extremities. Psychiatric: Normal mood and affect.  Laboratory Data: Lab Results  Component Value Date   WBC 6.9 10/18/2015   HGB 15.5 10/18/2015   HCT 44.0 10/18/2015   MCV 96.7 10/18/2015   PLT 242 10/18/2015    Lab Results  Component Value Date   CREATININE 0.96 07/07/2015    Component     Latest Ref Rng & Units 05/20/2012 01/04/2015 01/06/2015 05/10/2015  PSA     0.0 - 4.0 ng/mL 2.4 6.2 (H) 5.4 (H) 5.1 (H)   Component     Latest Ref Rng & Units 07/27/2015 04/07/2016 07/05/2016  PSA     0.0 - 4.0 ng/mL 5.2 (H) 1.72 1.8   Urinalysis N/a  Pertinent Imaging: Now new  interval imaging  Assessment & Plan:    1. Prostate cancer [facility]) S/p brachytherapy on 10/2015 for prostate cancer progression with possible high grade lesion on MRI PSA with appropriate response Nocturia x 3 with minimal bother F/u with Dr. Baruch Gouty (radiation oncology) 11/2016    Return in about 1 year (around 07/12/2017) for [facility] for PSA, IPSS, PVR.  Hollice Espy, MD  [facility] [address], [place], [place] [address] [phone]

## 2016-09-22 DIAGNOSIS — L309 Dermatitis, unspecified: Secondary | ICD-10-CM | POA: Diagnosis not present

## 2016-09-22 DIAGNOSIS — C61 Malignant neoplasm of prostate: Secondary | ICD-10-CM | POA: Diagnosis not present

## 2016-09-22 DIAGNOSIS — R079 Chest pain, unspecified: Secondary | ICD-10-CM | POA: Diagnosis not present

## 2016-09-22 DIAGNOSIS — R0789 Other chest pain: Secondary | ICD-10-CM | POA: Diagnosis not present

## 2016-09-29 IMAGING — CR DG CHEST 2V
2 series · 2 of 2 positions shown · non-contrast
Comparison: None.

CLINICAL DATA: For chest pressure since yesterday.

EXAM:
CHEST  2 VIEW

[chest pa]
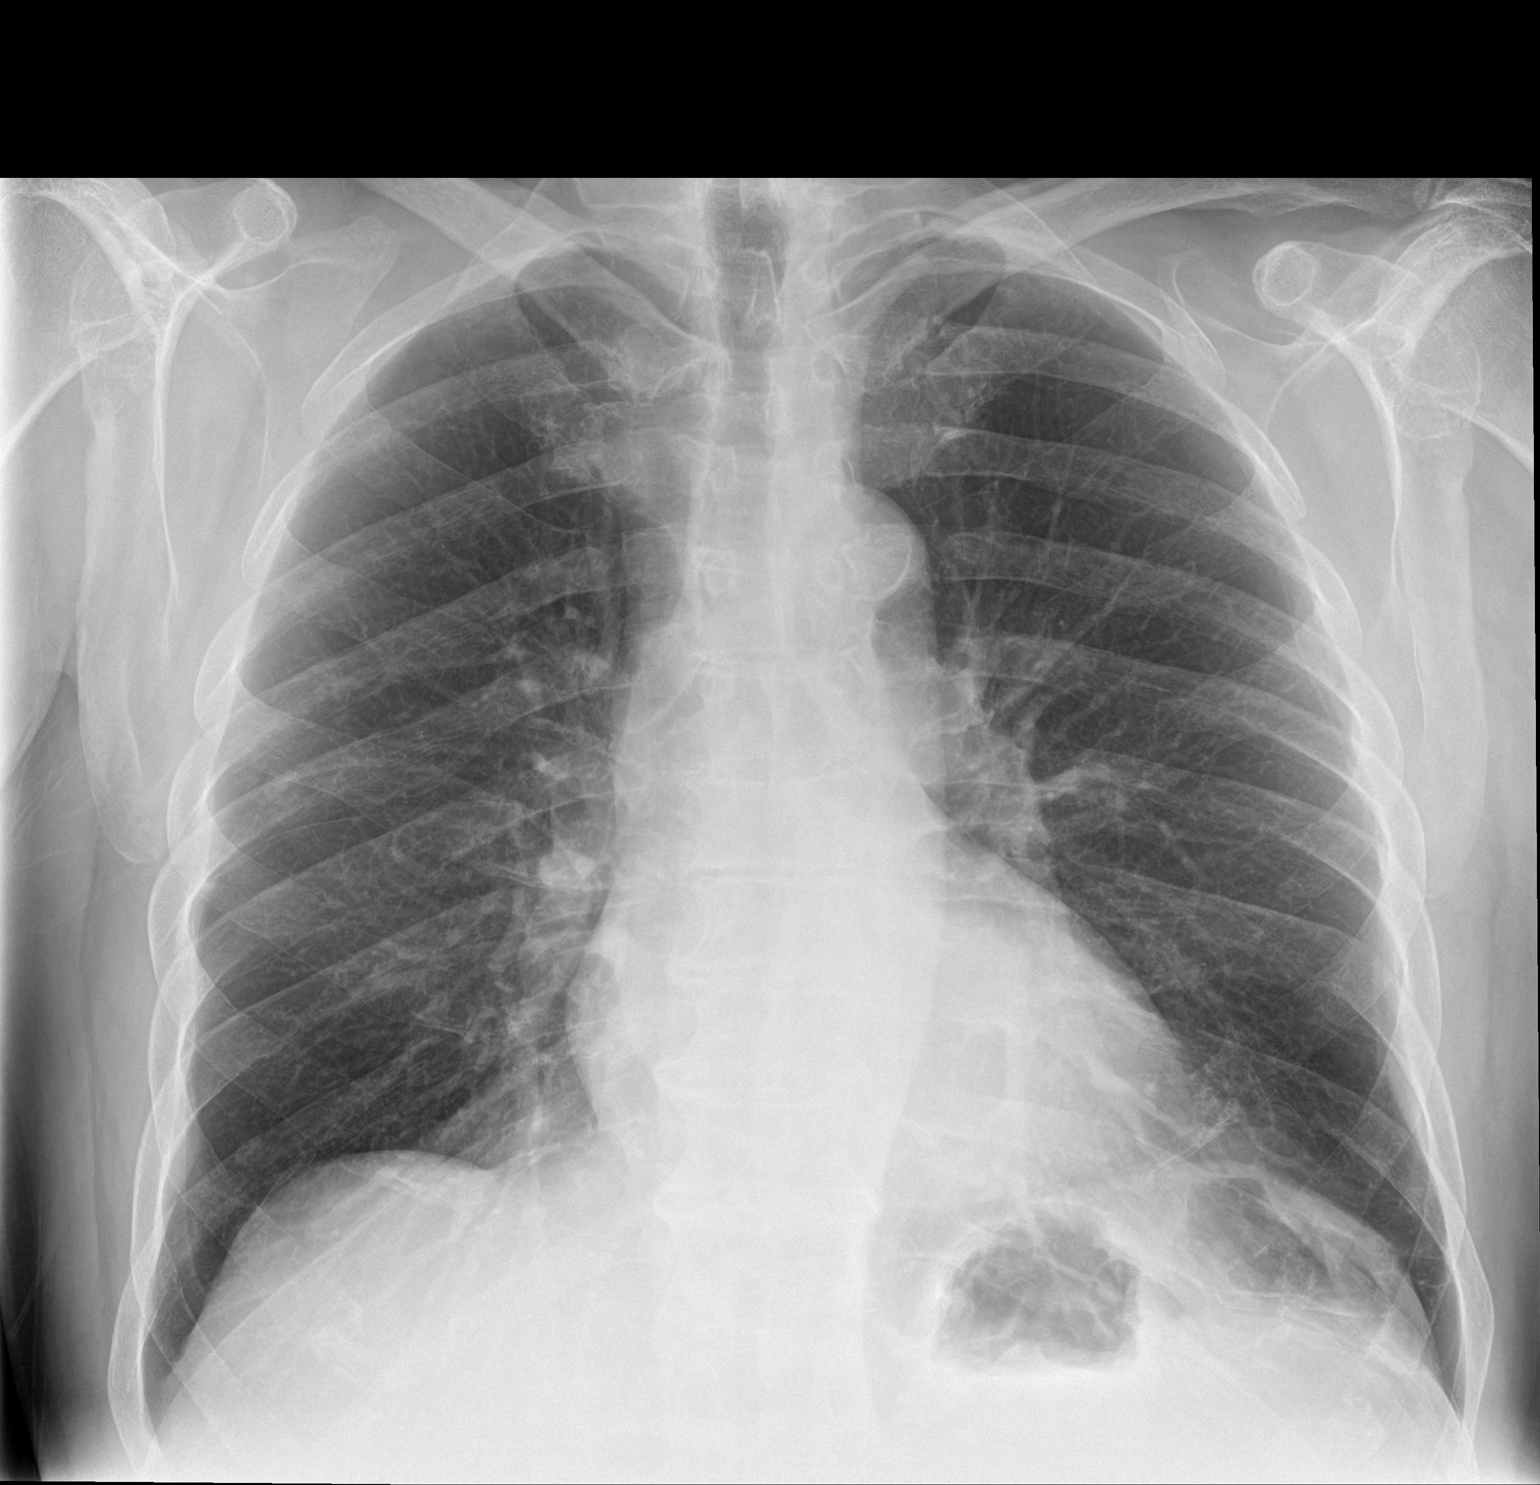

[chest lat]
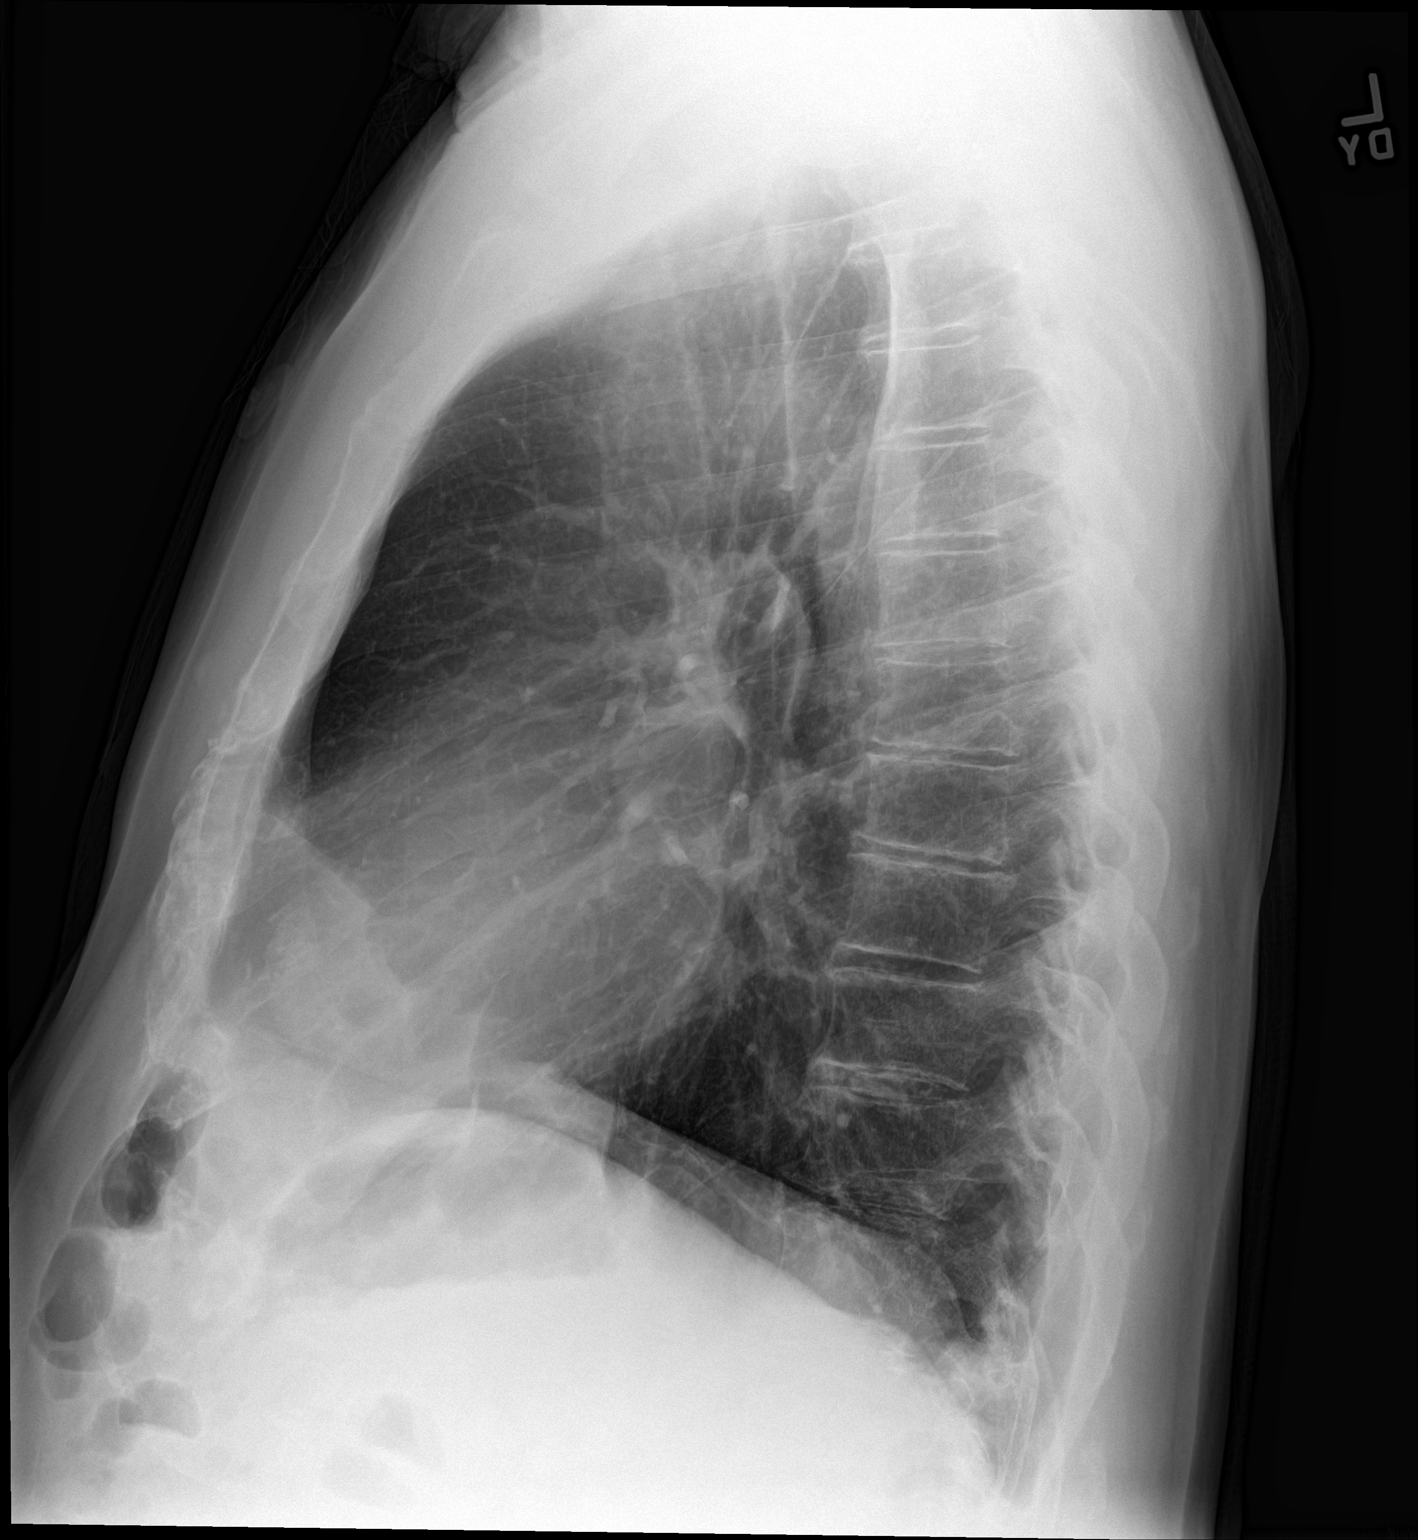

[2 of 2 positions shown; findings below may reference images not displayed]

FINDINGS: Heart and mediastinal contours are within normal limits. No focal
opacities or effusions. No acute bony abnormality.
IMPRESSION: No active cardiopulmonary disease.

## 2016-11-10 ENCOUNTER — Other Ambulatory Visit: Payer: Self-pay | Admitting: *Deleted

## 2016-11-10 DIAGNOSIS — C61 Malignant neoplasm of prostate: Secondary | ICD-10-CM

## 2016-11-13 ENCOUNTER — Inpatient Hospital Stay: Payer: Medicare Other | Attending: Radiation Oncology

## 2016-11-13 DIAGNOSIS — C61 Malignant neoplasm of prostate: Secondary | ICD-10-CM | POA: Diagnosis not present

## 2016-11-13 LAB — PSA: Prostatic Specific Antigen: 1.06 ng/mL (ref 0.00–4.00)

## 2016-11-20 ENCOUNTER — Encounter: Payer: Self-pay | Admitting: Radiation Oncology

## 2016-11-20 ENCOUNTER — Ambulatory Visit
Admission: RE | Admit: 2016-11-20 | Discharge: 2016-11-20 | Disposition: A | Discharge status: Home / Self Care | Payer: Medicare Other | Source: Ambulatory Visit | Attending: Radiation Oncology | Admitting: Radiation Oncology

## 2016-11-20 VITALS — BP 143/83 | HR 61 | Temp 97.8°F | Resp 20 | Wt 191.5 lb

## 2016-11-20 DIAGNOSIS — Z923 Personal history of irradiation: Secondary | ICD-10-CM | POA: Insufficient documentation

## 2016-11-20 DIAGNOSIS — C61 Malignant neoplasm of prostate: Secondary | ICD-10-CM

## 2016-11-20 NOTE — Progress Notes (Signed)
Radiation Oncology Follow up Note  Name: Tony Mendez   Date:   11/20/2016 MRN:  [id number] DOB: February 01, 1940    This 76 y.o. male presents to the clinic today for 1 year follow-up status post I-125 interstitial implant for Gleason 6 adenocarcinoma the prostate.  REFERRING PROVIDER: Cletis Athens, MD  HPI: Patient is a 76 year old male now out 1 year having completed I-125 interstitial implant for Gleason 6 (3+3) adenocarcinoma prostate presenting the PSA of 6. He is seen today in routine follow-up and is doing well specifically denies diarrhea dysuria or any other GI/GU complaints.. His PSA continues to drop is now 1.06 from 1.84 months prior.  COMPLICATIONS OF TREATMENT: none  FOLLOW UP COMPLIANCE: keeps appointments   PHYSICAL EXAM:  BP (!) 143/83   Pulse 61   Temp 97.8 F (36.6 C)   Resp 20   Wt 191 lb 7.5 oz (86.8 kg)   BMI 27.47 kg/m  On rectal exam rectal sphincter tone is good. Prostate is smooth contracted without evidence of nodularity or mass. Sulcus is preserved bilaterally. No discrete nodularity is identified. No other rectal abnormalities are noted. Well-developed well-nourished patient in NAD. HEENT reveals PERLA, EOMI, discs not visualized.  Oral cavity is clear. No oral mucosal lesions are identified. Neck is clear without evidence of cervical or supraclavicular adenopathy. Lungs are clear to A&P. Cardiac examination is essentially unremarkable with regular rate and rhythm without murmur rub or thrill. Abdomen is benign with no organomegaly or masses noted. Motor sensory and DTR levels are equal and symmetric in the upper and lower extremities. Cranial nerves II through XII are grossly intact. Proprioception is intact. No peripheral adenopathy or edema is identified. No motor or sensory levels are noted. Crude visual fields are within normal range.  RADIOLOGY RESULTS: No current films for review  PLAN: Present time patient continues under excellent biochemical  control of his prostate cancer. I'm please was overall progress. I've asked to see him back in 1 year for follow-up. PSA will be obtained prior to that visit. Patient knows to call with any concerns at any time.  I would like to take this opportunity to thank you for allowing me to participate in the care of your patient.Armstead Peaks., MD

## 2016-11-23 ENCOUNTER — Ambulatory Visit: Payer: Self-pay | Admitting: Radiation Oncology

## 2016-12-04 DIAGNOSIS — L309 Dermatitis, unspecified: Secondary | ICD-10-CM | POA: Diagnosis not present

## 2016-12-04 DIAGNOSIS — C61 Malignant neoplasm of prostate: Secondary | ICD-10-CM | POA: Diagnosis not present

## 2016-12-04 DIAGNOSIS — N4281 Prostatodynia syndrome: Secondary | ICD-10-CM | POA: Diagnosis not present

## 2016-12-04 DIAGNOSIS — N400 Enlarged prostate without lower urinary tract symptoms: Secondary | ICD-10-CM | POA: Diagnosis not present

## 2016-12-22 DIAGNOSIS — R0789 Other chest pain: Secondary | ICD-10-CM | POA: Diagnosis not present

## 2016-12-22 DIAGNOSIS — C61 Malignant neoplasm of prostate: Secondary | ICD-10-CM | POA: Diagnosis not present

## 2016-12-22 DIAGNOSIS — N4281 Prostatodynia syndrome: Secondary | ICD-10-CM | POA: Diagnosis not present

## 2016-12-22 DIAGNOSIS — J309 Allergic rhinitis, unspecified: Secondary | ICD-10-CM | POA: Diagnosis not present

## 2016-12-26 DIAGNOSIS — E7849 Other hyperlipidemia: Secondary | ICD-10-CM | POA: Diagnosis not present

## 2016-12-26 DIAGNOSIS — R5381 Other malaise: Secondary | ICD-10-CM | POA: Diagnosis not present

## 2016-12-26 DIAGNOSIS — I10 Essential (primary) hypertension: Secondary | ICD-10-CM | POA: Diagnosis not present

## 2016-12-27 DIAGNOSIS — R51 Headache: Secondary | ICD-10-CM | POA: Diagnosis not present

## 2016-12-27 DIAGNOSIS — L309 Dermatitis, unspecified: Secondary | ICD-10-CM | POA: Diagnosis not present

## 2016-12-27 DIAGNOSIS — R079 Chest pain, unspecified: Secondary | ICD-10-CM | POA: Diagnosis not present

## 2016-12-27 DIAGNOSIS — J019 Acute sinusitis, unspecified: Secondary | ICD-10-CM | POA: Diagnosis not present

## 2017-02-20 IMAGING — MR MR PROSTATE WO/W CM
54 series · 54 of 54 positions shown · IV contrast (19 MH)
Comparison: None.

CLINICAL DATA: Biopsy-proven prostate carcinoma.  Elevated PSA.

EXAM:
MR PROSTATE WITHOUT AND WITH CONTRAST
TECHNIQUE: Multiplanar multisequence MRI images were obtained of the pelvis
centered about the prostate. Pre and post contrast images were
obtained.
CONTRAST:  18mL MULTIHANCE GADOBENATE DIMEGLUMINE 529 MG/ML IV SOLN

[Series 3: T1 · axial · 8.0mm · 0.78mm/px · 1 of 24 slices shown (1 of 2)]
[im 1/24]
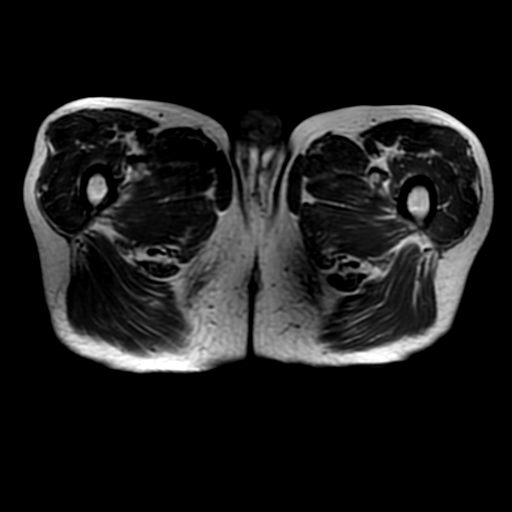

[Series 6: T1 · axial · 3.0mm · 0.31mm/px · 1 of 25 slices shown (2 of 2)]
[im 1/25]
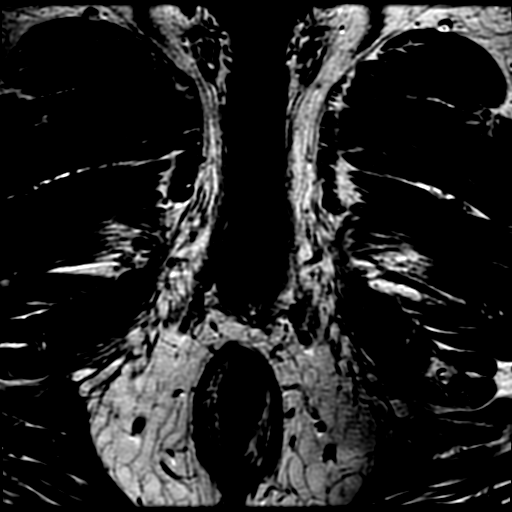

[Series 8: DWI · axial · 3.0mm · 0.70mm/px · 1 of 45 slices shown]
[im 1/45]
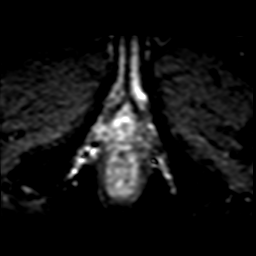

[Series 10: bSSFP fat-sat · axial · 8.0mm · 0.78mm/px · 1 of 24 slices shown]
[im 1/24]
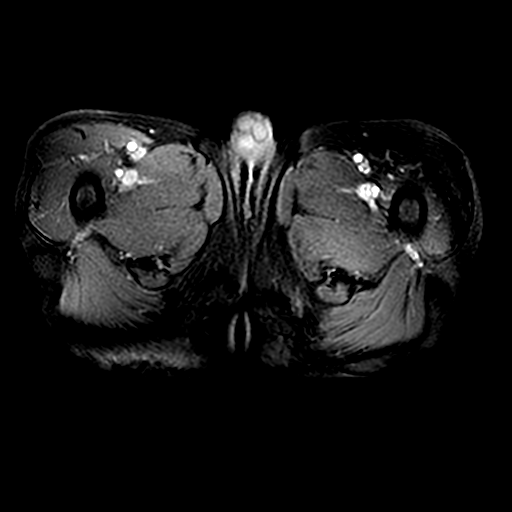

[Series 500: T2 · sagittal · 3.5mm · 0.62mm/px · 1 of 28 slices shown (1 of 3)]
[im 1/28]
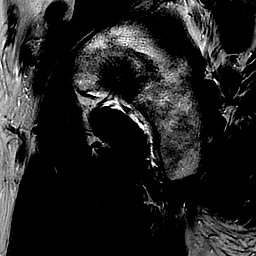

[Series 700: T2 · axial · 3.0mm · 0.62mm/px · 1 of 25 slices shown (2 of 3)]
[im 1/25]
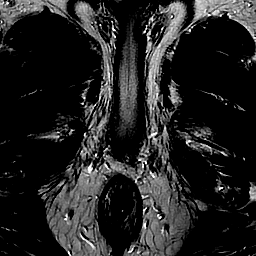

[Series 850: ADC · axial · 3.0mm · 0.70mm/px · 1 of 15 slices shown]
[im 1/15]
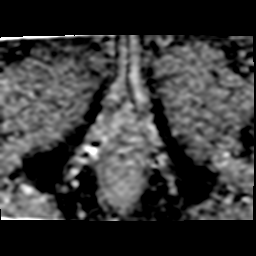

[Series 900: T2 · coronal · 3.0mm · 0.62mm/px · 1 of 25 slices shown (3 of 3)]
[im 1/25]
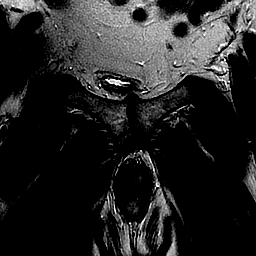

[Series 1100: T1 dynamic · axial · 4.0mm · 0.94mm/px · 1 of 26 slices shown (1 of 24)]
[im 1/26]
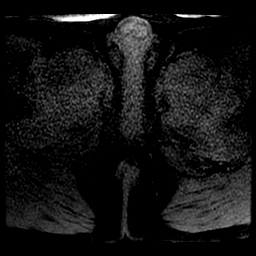

[Series 1101: T1 dynamic · axial · 4.0mm · 0.94mm/px · 1 of 26 slices shown (2 of 24)]
[im 1/26]
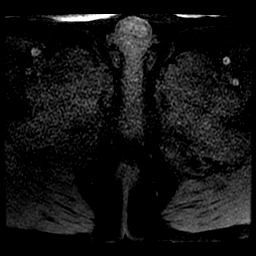

[Series 1102: T1 dynamic · axial · 4.0mm · 0.94mm/px · 1 of 26 slices shown (3 of 24)]
[im 1/26]
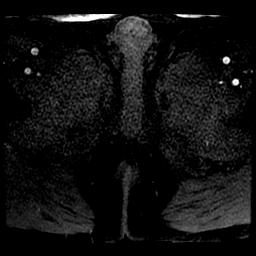

[Series 1103: T1 dynamic · axial · 4.0mm · 0.94mm/px · 1 of 26 slices shown (4 of 24)]
[im 1/26]
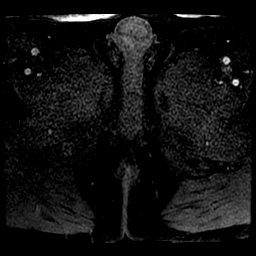

[Series 1104: T1 dynamic · axial · 4.0mm · 0.94mm/px · 1 of 26 slices shown (5 of 24)]
[im 1/26]
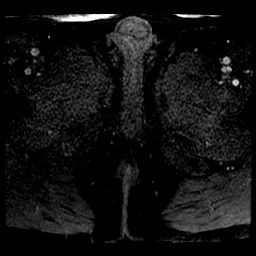

[Series 1105: T1 dynamic · axial · 4.0mm · 0.94mm/px · 1 of 26 slices shown (6 of 24)]
[im 1/26]
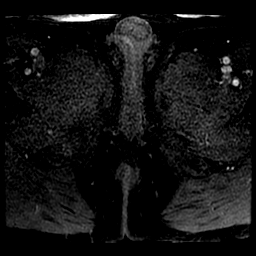

[Series 1106: T1 dynamic · axial · 4.0mm · 0.94mm/px · 1 of 26 slices shown (7 of 24)]
[im 1/26]
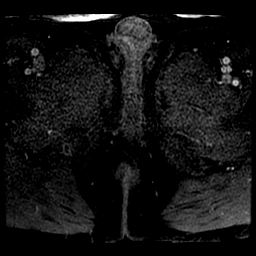

[Series 1107: T1 dynamic · axial · 4.0mm · 0.94mm/px · 1 of 26 slices shown (8 of 24)]
[im 1/26]
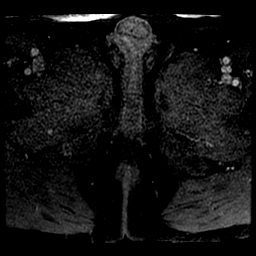

[Series 1108: T1 dynamic · axial · 4.0mm · 0.94mm/px · 1 of 26 slices shown (9 of 24)]
[im 1/26]
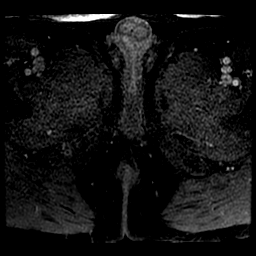

[Series 1109: T1 dynamic · axial · 4.0mm · 0.94mm/px · 1 of 26 slices shown (10 of 24)]
[im 1/26]
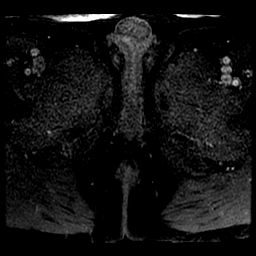

[Series 1110: T1 dynamic · axial · 4.0mm · 0.94mm/px · 1 of 26 slices shown (11 of 24)]
[im 1/26]
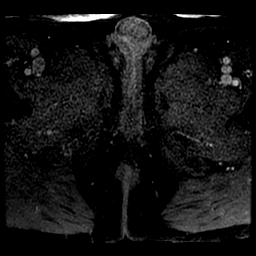

[Series 1111: T1 dynamic · axial · 4.0mm · 0.94mm/px · 1 of 26 slices shown (12 of 24)]
[im 1/26]
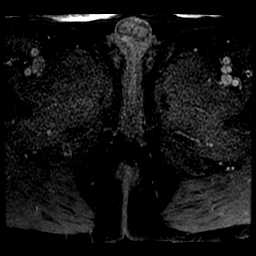

[Series 1112: T1 dynamic · axial · 4.0mm · 0.94mm/px · 1 of 26 slices shown (13 of 24)]
[im 1/26]
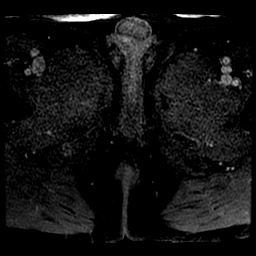

[Series 1113: T1 dynamic · axial · 4.0mm · 0.94mm/px · 1 of 26 slices shown (14 of 24)]
[im 1/26]
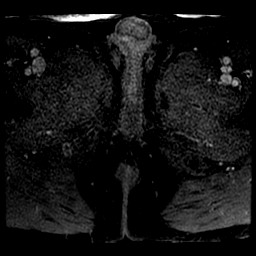

[Series 1114: T1 dynamic · axial · 4.0mm · 0.94mm/px · 1 of 26 slices shown (15 of 24)]
[im 1/26]
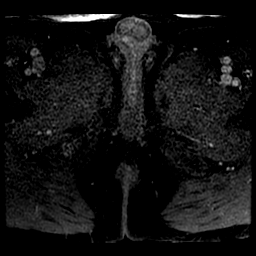

[Series 1115: T1 dynamic · axial · 4.0mm · 0.94mm/px · 1 of 26 slices shown (16 of 24)]
[im 1/26]
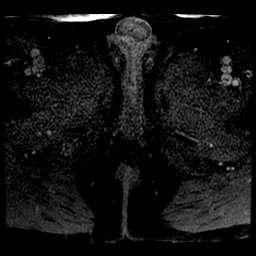

[Series 1116: T1 dynamic · axial · 4.0mm · 0.94mm/px · 1 of 26 slices shown (17 of 24)]
[im 1/26]
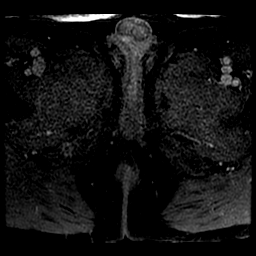

[Series 1117: T1 dynamic · axial · 4.0mm · 0.94mm/px · 1 of 26 slices shown (18 of 24)]
[im 1/26]
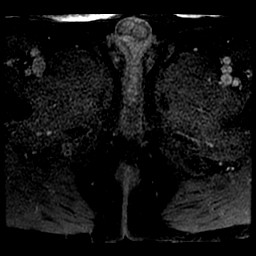

[Series 1118: T1 dynamic · axial · 4.0mm · 0.94mm/px · 1 of 26 slices shown (19 of 24)]
[im 1/26]
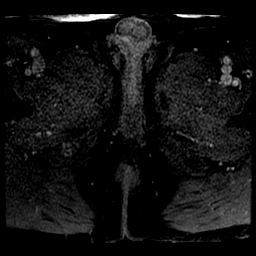

[Series 1119: T1 dynamic · axial · 4.0mm · 0.94mm/px · 1 of 26 slices shown (20 of 24)]
[im 1/26]
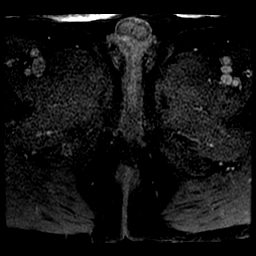

[Series 1120: T1 dynamic · axial · 4.0mm · 0.94mm/px · 1 of 26 slices shown (21 of 24)]
[im 1/26]
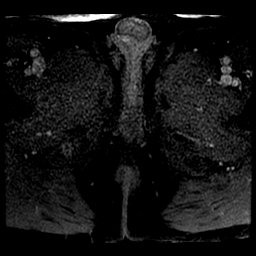

[Series 1121: T1 dynamic · axial · 4.0mm · 0.94mm/px · 1 of 26 slices shown (22 of 24)]
[im 1/26]
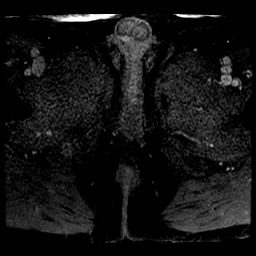

[Series 1122: T1 dynamic · axial · 4.0mm · 0.94mm/px · 1 of 26 slices shown (23 of 24)]
[im 1/26]
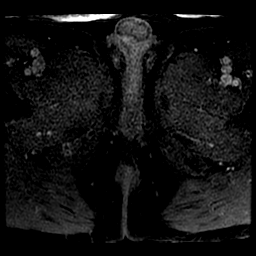

[Series 1123: T1 dynamic · axial · 4.0mm · 0.94mm/px · 1 of 26 slices shown (24 of 24)]
[im 1/26]
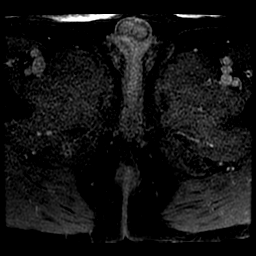

[((date))-((date)) · axial · 4.0mm · 0.94mm/px · 1 of 2 slices shown (1 of 22)]
[im 1/2]
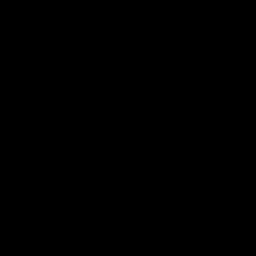

[((date))-((date)) · axial · 4.0mm · 0.94mm/px · 1 of 24 slices shown (2 of 22)]
[im 1/24]
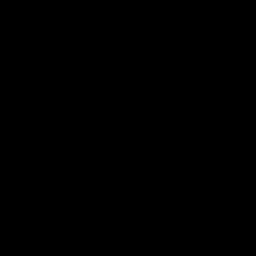

[((date))-((date)) · axial · 4.0mm · 0.94mm/px · 1 of 24 slices shown (3 of 22)]
[im 1/24]
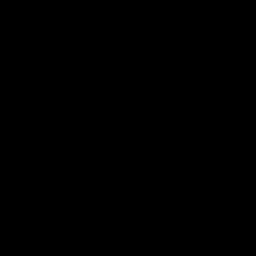

[((date))-((date)) · axial · 4.0mm · 0.94mm/px · 1 of 26 slices shown (4 of 22)]
[im 1/26]
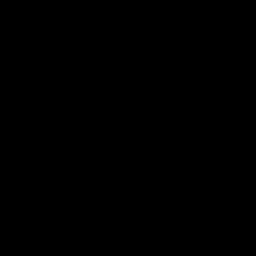

[((date))-((date)) · axial · 4.0mm · 0.94mm/px · 1 of 25 slices shown (5 of 22)]
[im 1/25]
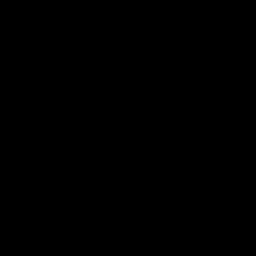

[((date))-((date)) · axial · 4.0mm · 0.94mm/px · 1 of 26 slices shown (6 of 22)]
[im 1/26]
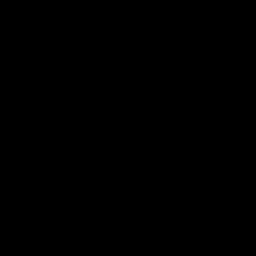

[((date))-((date)) · axial · 4.0mm · 0.94mm/px · 1 of 26 slices shown (7 of 22)]
[im 1/26]
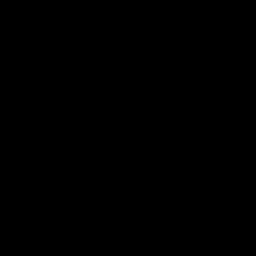

[((date))-((date)) · axial · 4.0mm · 0.94mm/px · 1 of 25 slices shown (8 of 22)]
[im 1/25]
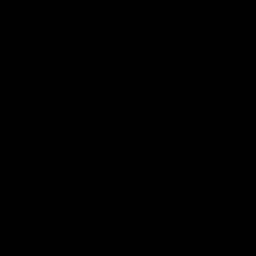

[((date))-((date)) · axial · 4.0mm · 0.94mm/px · 1 of 26 slices shown (9 of 22)]
[im 1/26]
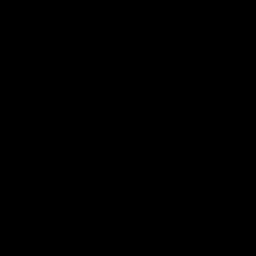

[((date))-((date)) · axial · 4.0mm · 0.94mm/px · 1 of 25 slices shown (10 of 22)]
[im 1/25]
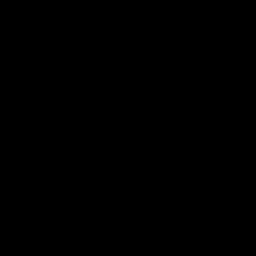

[((date))-((date)) · axial · 4.0mm · 0.94mm/px · 1 of 26 slices shown (11 of 22)]
[im 1/26]
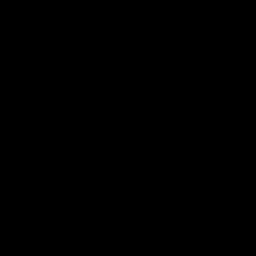

[((date))-((date)) · axial · 4.0mm · 0.94mm/px · 1 of 26 slices shown (12 of 22)]
[im 1/26]
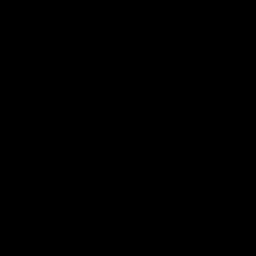

[((date))-((date)) · axial · 4.0mm · 0.94mm/px · 1 of 26 slices shown (13 of 22)]
[im 1/26]
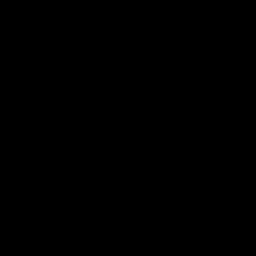

[((date))-((date)) · axial · 4.0mm · 0.94mm/px · 1 of 26 slices shown (14 of 22)]
[im 1/26]
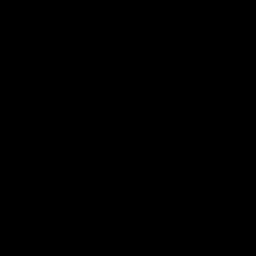

[((date))-((date)) · axial · 4.0mm · 0.94mm/px · 1 of 26 slices shown (15 of 22)]
[im 1/26]
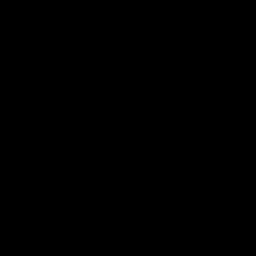

[((date))-((date)) · axial · 4.0mm · 0.94mm/px · 1 of 26 slices shown (16 of 22)]
[im 1/26]
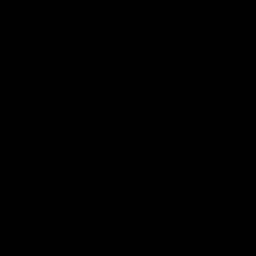

[((date))-((date)) · axial · 4.0mm · 0.94mm/px · 1 of 26 slices shown (17 of 22)]
[im 1/26]
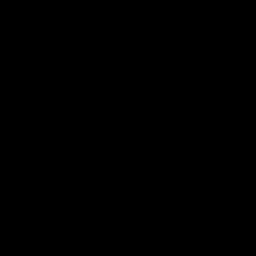

[((date))-((date)) · axial · 4.0mm · 0.94mm/px · 1 of 26 slices shown (18 of 22)]
[im 1/26]
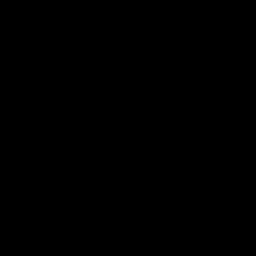

[((date))-((date)) · axial · 4.0mm · 0.94mm/px · 1 of 26 slices shown (19 of 22)]
[im 1/26]
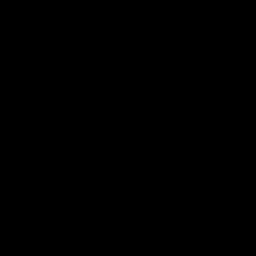

[((date))-((date)) · axial · 4.0mm · 0.94mm/px · 1 of 26 slices shown (20 of 22)]
[im 1/26]
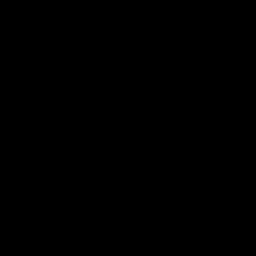

[((date))-((date)) · axial · 4.0mm · 0.94mm/px · 1 of 26 slices shown (21 of 22)]
[im 1/26]
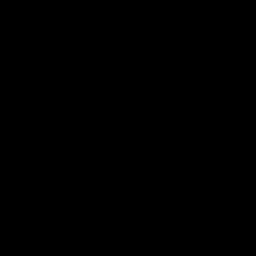

[((date))-((date)) · axial · 4.0mm · 0.94mm/px · 1 of 26 slices shown (22 of 22)]
[im 1/26]
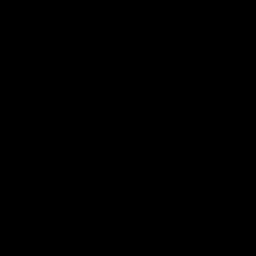

[54 of 54 positions shown; findings below may reference images not displayed]

FINDINGS: Prostate: There is loss of signal intensity within the peripheral
zone on T2 weighted imaging within the RIGHT base, RIGHT mid gland,
and RIGHT lateral mid gland. Focus of loss of signal intensity
within the LEFT base additionally.

There is a focus of restricted diffusion in the RIGHT base/mid gland
measuring 1.7 x 1.5 cm (image 10, series 8 ). This corresponds to
the signal abnormality on the T2 weighted imaging.

There is a smaller focus of restricted diffusion in the LEFT base on
image 10. Less intense restricted diffusion at the midline LEFT and
RIGHT mid gland on image 8 series 850.

Transitional zone is nodular and minimally enlarged with no clear
abnormal nodules in the transitional zone.

The prostatic capsule appears intact. The recto prostatic angles are
sharp. The neurovascular bundles appear intact.

There is blood product within the RIGHT seminal vesicle as seen on
the T1 weighted series 6.

No pelvic lymphadenopathy.  No bone metastasis.

Transcapsular spread:  Absent

Seminal vesicle involvement: Absent

Neurovascular bundle involvement: Absent

Pelvic adenopathy: Absent

Bone metastasis: absent

Other findings: Prostate gland measures 5.4 by 4.3. By 4.4 cm.
Sigmoid diverticulosis. Normal bladder
IMPRESSION: 1. High-grade carcinoma measuring 1.7 cm at the RIGHT base / mid
gland (BI-RADS 5).
2. Additional foci of carcinoma within the RIGHT LEFT mid gland.
3. No transscapular spread or neurovascular involvement.
4. Blood within the RIGHT seminal vesicle presumably relates to
biopsy.
5. No adenopathy.

## 2017-07-09 ENCOUNTER — Other Ambulatory Visit: Payer: Self-pay | Admitting: Family Medicine

## 2017-07-09 DIAGNOSIS — C61 Malignant neoplasm of prostate: Secondary | ICD-10-CM

## 2017-07-10 ENCOUNTER — Other Ambulatory Visit: Payer: Medicare Other

## 2017-07-10 DIAGNOSIS — C61 Malignant neoplasm of prostate: Secondary | ICD-10-CM | POA: Diagnosis not present

## 2017-07-11 LAB — PSA: PROSTATE SPECIFIC AG, SERUM: 0.8 ng/mL (ref 0.0–4.0)

## 2017-07-11 NOTE — Progress Notes (Signed)
07/12/2017 10:36 AM   Tony Mendez 14-Jan-1941 [id number]  Referring provider: Cletis Athens, MD [address] [place], [place] [address]  No chief complaint on file.   HPI: Patient is a 77 year old Caucasian male with prostate cancer who presents today for follow up.  Background history 77 yo M with prostate cancer s/p brachytherapy on 11/01/15.   He was initially dx with Gleason 3+3 prostate cancer previously on active surveillance who underwent prostate MRI which showed a concerning area at the right base for high grade carcinoma.  Due to concern for higher risk lesion, he elected to proceed with brachytherapy in 10/2015.    PSA trend as below.  Most recent PSA 0.8 on 06/2017.  Today, he is experiencing frequency and nocturia.  Patient denies any gross hematuria, dysuria or suprapubic/flank pain.  Patient denies any fevers, chills, nausea or vomiting.   He is not currently sexually active.  PMH: Past Medical History:  Diagnosis Date  . Arthritis   . BPH (benign prostatic hyperplasia)   . Cancer ([place])   . Elevated PSA 02/02/2015  . GERD (gastroesophageal reflux disease)   . Gout   . Motion sickness   . Nocturia   . Prostate cancer ([place])   . Prostatitis     Surgical History: Past Surgical History:  Procedure Laterality Date  . APPENDECTOMY    . COLONOSCOPY WITH PROPOFOL N/A 03/20/2016   Procedure: COLONOSCOPY WITH PROPOFOL;  Surgeon: Lucilla Lame, MD;  Location: [place];  Service: Endoscopy;  Laterality: N/A;  . CYSTOSCOPY  11/01/2015   Procedure: CYSTOSCOPY;  Surgeon: Hollice Espy, MD;  Location: [facility];  Service: Urology;;  . POLYPECTOMY  03/20/2016   Procedure: POLYPECTOMY;  Surgeon: Lucilla Lame, MD;  Location: [place];  Service: Endoscopy;;  . RADIOACTIVE SEED IMPLANT N/A 11/01/2015   Procedure: RADIOACTIVE SEED IMPLANT/BRACHYTHERAPY IMPLANT;  Surgeon: Hollice Espy, MD;  Location: [facility];  Service: Urology;  Laterality: N/A;  . SKIN  CANCER EXCISION    . TONSILLECTOMY      Home Medications:  Allergies as of 07/12/2017      Reactions   Flomax [tamsulosin Hcl] Other (See Comments)   Dizziness      Medication List        Accurate as of 07/12/17 10:36 AM. Always use your most recent med list.          aspirin 81 MG tablet Take 81 mg by mouth daily. Reported on 02/02/2015   CINNAMON PO Take by mouth daily.   docusate sodium 100 MG capsule Commonly known as:  COLACE Take 100 mg by mouth 2 (two) times daily.   GARLIC PO Take by mouth.   lisinopril 20 MG tablet Commonly known as:  PRINIVIL,ZESTRIL Take 20 mg by mouth daily.   multivitamin tablet Take 1 tablet by mouth daily.       Allergies:  Allergies  Allergen Reactions  . Flomax [Tamsulosin Hcl] Other (See Comments)    Dizziness    Family History: Family History  Problem Relation Age of Onset  . Heart disease Father   . Kidney disease Father        one kidney removed  . Stroke Mother   . Prostate cancer Neg Hx     Social History:  reports that he quit smoking about 45 years ago. He has never used smokeless tobacco. He reports that he drinks about 12.6 oz of alcohol per week. He reports that he does not use drugs.  ROS: UROLOGY Frequent Urination?: Yes Hard to postpone urination?: No Burning/pain with urination?: No Get up at night to urinate?: Yes Leakage of urine?: No Urine stream starts and stops?: No Trouble starting stream?: No Do you have to strain to urinate?: No Blood in urine?: No Urinary tract infection?: No Sexually transmitted disease?: No Injury to kidneys or bladder?: No Painful intercourse?: No Weak stream?: No Erection problems?: No Penile pain?: No  Gastrointestinal Nausea?: No Vomiting?: No Indigestion/heartburn?: No Diarrhea?: No Constipation?: Yes  Constitutional Fever: No Night sweats?: No Weight loss?: No Fatigue?: No  Skin Skin rash/lesions?: No Itching?: No  Eyes Blurred vision?:  No Double vision?: No  Ears/Nose/Throat Sore throat?: No Sinus problems?: No  Hematologic/Lymphatic Swollen glands?: No Easy bruising?: No  Cardiovascular Leg swelling?: No Chest pain?: No  Respiratory Cough?: No Shortness of breath?: No  Endocrine Excessive thirst?: No  Musculoskeletal Back pain?: No Joint pain?: No  Neurological Headaches?: No Dizziness?: No  Psychologic Depression?: No Anxiety?: No  Physical Exam: BP 120/62   Pulse 64   Resp 16   Ht 5\' 10"  (1.778 m)   Wt 189 lb 4.8 oz (85.9 kg)   SpO2 96%   BMI 27.16 kg/m   Constitutional: Well nourished. Alert and oriented, No acute distress. HEENT: [place] AT, moist mucus membranes. Trachea midline, no masses. Cardiovascular: No clubbing, cyanosis, or edema. Respiratory: Normal respiratory effort, no increased work of breathing. Skin: No rashes, bruises or suspicious lesions. Lymph: No cervical or inguinal adenopathy. Neurologic: Grossly intact, no focal deficits, moving all 4 extremities. Psychiatric: Normal mood and affect.   Laboratory Data: Lab Results  Component Value Date   WBC 6.9 10/18/2015   HGB 15.5 10/18/2015   HCT 44.0 10/18/2015   MCV 96.7 10/18/2015   PLT 242 10/18/2015    Lab Results  Component Value Date   CREATININE 0.96 07/07/2015    Component     Latest Ref Rng & Units 05/20/2012 01/04/2015 01/06/2015 05/10/2015  PSA     0.0 - 4.0 ng/mL 2.4 6.2 (H) 5.4 (H) 5.1 (H)   Component     Latest Ref Rng & Units 07/27/2015 04/07/2016 07/05/2016  PSA     0.0 - 4.0 ng/mL 5.2 (H) 1.72 1.8   Urinalysis N/a  Pertinent Imaging: Now new interval imaging  Assessment & Plan:    1. Prostate cancer [facility]) S/p brachytherapy on 10/2015 for prostate cancer progression with possible high grade lesion on MRI PSA with appropriate response Nocturia x 3 with minimal bother F/u with Dr. Baruch Gouty (radiation oncology) 11/2017   Return in about 1 year (around 07/13/2018) for exam and PSA  .  Zara Council, PA-C  [facility] [address], [place], [place] [address] [phone]

## 2017-07-12 ENCOUNTER — Encounter: Payer: Self-pay | Admitting: Urology

## 2017-07-12 ENCOUNTER — Ambulatory Visit: Payer: Medicare Other | Admitting: Urology

## 2017-07-12 VITALS — BP 120/62 | HR 64 | Resp 16 | Ht 70.0 in | Wt 189.3 lb

## 2017-07-12 DIAGNOSIS — C61 Malignant neoplasm of prostate: Secondary | ICD-10-CM

## 2017-08-08 DIAGNOSIS — J3089 Other allergic rhinitis: Secondary | ICD-10-CM | POA: Diagnosis not present

## 2017-08-08 DIAGNOSIS — N400 Enlarged prostate without lower urinary tract symptoms: Secondary | ICD-10-CM | POA: Diagnosis not present

## 2017-08-08 DIAGNOSIS — Z0000 Encounter for general adult medical examination without abnormal findings: Secondary | ICD-10-CM | POA: Diagnosis not present

## 2017-08-08 DIAGNOSIS — R0602 Shortness of breath: Secondary | ICD-10-CM | POA: Diagnosis not present

## 2017-08-08 DIAGNOSIS — M549 Dorsalgia, unspecified: Secondary | ICD-10-CM | POA: Diagnosis not present

## 2017-08-17 DIAGNOSIS — R079 Chest pain, unspecified: Secondary | ICD-10-CM | POA: Diagnosis not present

## 2017-08-17 DIAGNOSIS — M549 Dorsalgia, unspecified: Secondary | ICD-10-CM | POA: Diagnosis not present

## 2017-08-17 DIAGNOSIS — R0602 Shortness of breath: Secondary | ICD-10-CM | POA: Diagnosis not present

## 2017-08-22 DIAGNOSIS — R0602 Shortness of breath: Secondary | ICD-10-CM | POA: Diagnosis not present

## 2017-08-22 DIAGNOSIS — R079 Chest pain, unspecified: Secondary | ICD-10-CM | POA: Diagnosis not present

## 2017-08-22 DIAGNOSIS — M549 Dorsalgia, unspecified: Secondary | ICD-10-CM | POA: Diagnosis not present

## 2017-08-22 DIAGNOSIS — J3089 Other allergic rhinitis: Secondary | ICD-10-CM | POA: Diagnosis not present

## 2017-09-03 DIAGNOSIS — L821 Other seborrheic keratosis: Secondary | ICD-10-CM | POA: Diagnosis not present

## 2017-09-03 DIAGNOSIS — L570 Actinic keratosis: Secondary | ICD-10-CM | POA: Diagnosis not present

## 2017-11-21 ENCOUNTER — Inpatient Hospital Stay: Payer: Medicare Other | Attending: Radiation Oncology

## 2017-11-21 DIAGNOSIS — C61 Malignant neoplasm of prostate: Secondary | ICD-10-CM | POA: Diagnosis present

## 2017-11-21 LAB — PSA: Prostatic Specific Antigen: 1.04 ng/mL (ref 0.00–4.00)

## 2017-11-26 ENCOUNTER — Encounter: Payer: Self-pay | Admitting: Radiation Oncology

## 2017-11-26 ENCOUNTER — Other Ambulatory Visit: Payer: Self-pay

## 2017-11-26 ENCOUNTER — Ambulatory Visit
Admission: RE | Admit: 2017-11-26 | Discharge: 2017-11-26 | Disposition: A | Discharge status: Home / Self Care | Payer: Medicare Other | Source: Ambulatory Visit | Attending: Radiation Oncology | Admitting: Radiation Oncology

## 2017-11-26 VITALS — BP 127/76 | HR 64 | Temp 98.4°F | Resp 18 | Wt 191.9 lb

## 2017-11-26 DIAGNOSIS — Z923 Personal history of irradiation: Secondary | ICD-10-CM | POA: Insufficient documentation

## 2017-11-26 DIAGNOSIS — C61 Malignant neoplasm of prostate: Secondary | ICD-10-CM | POA: Diagnosis present

## 2017-11-26 NOTE — Progress Notes (Signed)
Radiation Oncology Follow up Note  Name: Tony Mendez   Date:   11/26/2017 MRN:  [id number] DOB: 06-24-1940    This 77 y.o. male presents to the clinic today for to year follow-up status post I-125 interstitial implant for Gleason 6 adenocarcinoma of the prostate.  REFERRING PROVIDER: Cletis Athens, MD  HPI: patient is a 77 year old male now out 2 years having completed I-125 interstitial implant for a Gleason 6 adenocarcinoma the prostate presenting the PSA of 6. He is seen today in routine follow-up is doing well. He specifically denies any increased lower urinary tract symptoms diarrhea or fatigue. His most recent PSA is actually stable at 1.04.Marland Kitchen  COMPLICATIONS OF TREATMENT: none  FOLLOW UP COMPLIANCE: keeps appointments   PHYSICAL EXAM:  BP 127/76 (BP Location: Left Arm, Patient Position: Sitting)   Pulse 64   Temp 98.4 F (36.9 C) (Tympanic)   Resp 18   Wt 191 lb 14.6 oz (87.1 kg)   BMI 27.54 kg/m  Well-developed well-nourished patient in NAD. HEENT reveals PERLA, EOMI, discs not visualized.  Oral cavity is clear. No oral mucosal lesions are identified. Neck is clear without evidence of cervical or supraclavicular adenopathy. Lungs are clear to A&P. Cardiac examination is essentially unremarkable with regular rate and rhythm without murmur rub or thrill. Abdomen is benign with no organomegaly or masses noted. Motor sensory and DTR levels are equal and symmetric in the upper and lower extremities. Cranial nerves II through XII are grossly intact. Proprioception is intact. No peripheral adenopathy or edema is identified. No motor or sensory levels are noted. Crude visual fields are within normal range.  RADIOLOGY RESULTS: no current films for review  PLAN: at the present time patient is doing well under excellent biochemical control of his prostate cancer. I'm please was overall progress. I've asked to see him back in 1 year with a PSA prior to that visit. Patient is to call  with any concerns at any time.  I would like to take this opportunity to thank you for allowing me to participate in the care of your patient.Noreene Filbert, MD

## 2017-12-21 DIAGNOSIS — R0789 Other chest pain: Secondary | ICD-10-CM | POA: Diagnosis not present

## 2017-12-21 DIAGNOSIS — R51 Headache: Secondary | ICD-10-CM | POA: Diagnosis not present

## 2017-12-21 DIAGNOSIS — R0602 Shortness of breath: Secondary | ICD-10-CM | POA: Diagnosis not present

## 2017-12-21 DIAGNOSIS — J219 Acute bronchiolitis, unspecified: Secondary | ICD-10-CM | POA: Diagnosis not present

## 2018-02-07 DIAGNOSIS — R0789 Other chest pain: Secondary | ICD-10-CM | POA: Diagnosis not present

## 2018-02-07 DIAGNOSIS — N4281 Prostatodynia syndrome: Secondary | ICD-10-CM | POA: Diagnosis not present

## 2018-02-07 DIAGNOSIS — R51 Headache: Secondary | ICD-10-CM | POA: Diagnosis not present

## 2018-02-13 DIAGNOSIS — J309 Allergic rhinitis, unspecified: Secondary | ICD-10-CM | POA: Diagnosis not present

## 2018-02-13 DIAGNOSIS — N4281 Prostatodynia syndrome: Secondary | ICD-10-CM | POA: Diagnosis not present

## 2018-02-13 DIAGNOSIS — R0602 Shortness of breath: Secondary | ICD-10-CM | POA: Diagnosis not present

## 2018-02-13 DIAGNOSIS — J019 Acute sinusitis, unspecified: Secondary | ICD-10-CM | POA: Diagnosis not present

## 2018-03-14 ENCOUNTER — Ambulatory Visit: Payer: Medicare Other | Admitting: Gastroenterology

## 2018-03-14 ENCOUNTER — Encounter: Payer: Self-pay | Admitting: Gastroenterology

## 2018-03-14 ENCOUNTER — Other Ambulatory Visit: Payer: Self-pay

## 2018-03-14 ENCOUNTER — Encounter (INDEPENDENT_AMBULATORY_CARE_PROVIDER_SITE_OTHER): Payer: Self-pay

## 2018-03-14 VITALS — BP 110/60 | HR 79 | Ht 70.0 in | Wt 187.0 lb

## 2018-03-14 DIAGNOSIS — K5900 Constipation, unspecified: Secondary | ICD-10-CM

## 2018-03-14 NOTE — Progress Notes (Signed)
Primary Care Physician: Cletis Athens, MD  Primary Gastroenterologist:  Dr. Lucilla Lame  Chief Complaint  Patient presents with  . Constipation    HPI: Tony Mendez is a 78 y.o. male here for constipation.  The patient has a history of prostate cancer and is being followed for that.  His last colonoscopy was done by me in 2018 for a history of colon polyps.  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  There has been regular follow-up of his colon polyps and he was recommended to have a repeat colonoscopy in 5 years after that last polyp removal. The patient reports that he has been constipated since having a flulike syndrome.  There is no report of any unexplained weight loss fevers chills nausea vomiting black stools or bloody stools.  The patient also states that he usually takes something for his constipation and is cleaned out and then weights to take something again only when he becomes constipated again.  He was taking senna and prune juice and large amounts and stated that he had significant diarrhea.  The patient then reports that he took one senna and prune juice yesterday and had a very good bowel movement this morning that he reports to be better than he had in the last few months.  Current Outpatient Medications  Medication Sig Dispense Refill  . aspirin 81 MG tablet Take 81 mg by mouth daily. Reported on 02/02/2015    . CINNAMON PO Take by mouth daily.    Marland Kitchen docusate sodium (COLACE) 100 MG capsule Take 100 mg by mouth 2 (two) times daily.    Marland Kitchen GARLIC PO Take by mouth.    Marland Kitchen lisinopril (PRINIVIL,ZESTRIL) 20 MG tablet Take 20 mg by mouth daily.  0  . Multiple Vitamin (MULTIVITAMIN) tablet Take 1 tablet by mouth daily.    . pantoprazole (PROTONIX) 40 MG tablet TK 1 T PO QD    . sildenafil (VIAGRA) 50 MG tablet TK 1 T PO PRN     No current facility-administered medications for this visit.     Allergies as of 03/14/2018 - Review  Complete 03/14/2018  Allergen Reaction Noted  . Flomax [tamsulosin hcl] Other (See Comments) 12/21/2014    ROS:  General: Negative for anorexia, weight loss, fever, chills, fatigue, weakness. ENT: Negative for hoarseness, difficulty swallowing , nasal congestion. CV: Negative for chest pain, angina, palpitations, dyspnea on exertion, peripheral edema.  Respiratory: Negative for dyspnea at rest, dyspnea on exertion, cough, sputum, wheezing.  GI: See history of present illness. GU:  Negative for dysuria, hematuria, urinary incontinence, urinary frequency, nocturnal urination.  Endo: Negative for unusual weight change.    Physical Examination:   BP 110/60   Pulse 79   Ht 5\' 10"  (1.778 m)   Wt 187 lb (84.8 kg)   BMI 26.83 kg/m   General: Well-nourished, well-developed in no acute distress.  Eyes: No icterus. Conjunctivae pink. Mouth: Oropharyngeal mucosa moist and pink , no lesions erythema or exudate. Lungs: Clear to auscultation bilaterally. Non-labored. Heart: Regular rate and rhythm, no murmurs rubs or gallops.  Abdomen: Bowel sounds are normal, nontender, nondistended, no hepatosplenomegaly or masses, no abdominal bruits or hernia , no rebound or guarding.   Extremities: No lower extremity edema. No clubbing or deformities. Neuro: Alert and oriented x 3.  Grossly intact. Skin: Warm and dry, no jaundice.   Psych: Alert and cooperative, normal mood and affect.  Labs:    Imaging  Studies: No results found.  Assessment and Plan:   Tony Mendez is a 78 y.o. y/o male who has a history of constipation that is being controlled now with Senna and prune juice. The patient has been told to take something every day to keep him regular instead of intermittently when he is constipated.  He has also been told the benefits of a high fiber diet. The patient is not due for colonoscopy at this time and has no worrisome symptoms. The patient has been explained the plan and agrees with  it.    Lucilla Lame, MD. [name]   Note: This dictation was prepared with Dragon dictation along with smaller phrase technology. Any transcriptional errors that result from this process are unintentional.

## 2018-07-11 ENCOUNTER — Other Ambulatory Visit: Payer: Self-pay

## 2018-07-11 DIAGNOSIS — C61 Malignant neoplasm of prostate: Secondary | ICD-10-CM

## 2018-07-15 ENCOUNTER — Other Ambulatory Visit: Payer: Medicare Other

## 2018-07-15 ENCOUNTER — Other Ambulatory Visit: Payer: Self-pay

## 2018-07-15 DIAGNOSIS — C61 Malignant neoplasm of prostate: Secondary | ICD-10-CM

## 2018-07-16 LAB — PSA: Prostate Specific Ag, Serum: 0.5 ng/mL (ref 0.0–4.0)

## 2018-07-16 NOTE — Progress Notes (Signed)
07/17/2018 8:51 AM   Tony Mendez Feb 07, 1940 [id number]  Referring provider: Cletis Athens, MD [address] [place],   [address]  Chief Complaint  Patient presents with  . Prostate Cancer    HPI: Patient is a 78 year old Caucasian male with prostate cancer who presents today for follow up.  Prostate cancer s/p brachytherapy on 11/01/15.   Initially dx with Gleason 3+3 prostate cancer previously on active surveillance who underwent prostate MRI which showed a concerning area at the right base for high grade carcinoma.  Due to concern for higher risk lesion, he elected to proceed with brachytherapy in 10/2015.    PSA trend as below.  Most recent PSA 0.5 on 06/2018.  He is having urinary frequency (goes quite a few times) and nocturia x 2-3.  He states he doesn't go far from home and he knows where every bathroom is on road trips and shopping trips.  Patient denies any gross hematuria, dysuria or suprapubic/flank pain.  Patient denies any fevers, chills, nausea or vomiting.   PMH: Past Medical History:  Diagnosis Date  . Arthritis   . BPH (benign prostatic hyperplasia)   . Cancer ([place])   . Elevated PSA 02/02/2015  . GERD (gastroesophageal reflux disease)   . Gout   . Motion sickness   . Nocturia   . Prostate cancer ([place])   . Prostatitis     Surgical History: Past Surgical History:  Procedure Laterality Date  . APPENDECTOMY    . COLONOSCOPY WITH PROPOFOL N/A 03/20/2016   Procedure: COLONOSCOPY WITH PROPOFOL;  Surgeon: Lucilla Lame, MD;  Location: [place];  Service: Endoscopy;  Laterality: N/A;  . CYSTOSCOPY  11/01/2015   Procedure: CYSTOSCOPY;  Surgeon: Hollice Espy, MD;  Location: [facility];  Service: Urology;;  . POLYPECTOMY  03/20/2016   Procedure: POLYPECTOMY;  Surgeon: Lucilla Lame, MD;  Location: [place];  Service: Endoscopy;;  . RADIOACTIVE SEED IMPLANT N/A 11/01/2015   Procedure: RADIOACTIVE SEED IMPLANT/BRACHYTHERAPY IMPLANT;   Surgeon: Hollice Espy, MD;  Location: [facility];  Service: Urology;  Laterality: N/A;  . SKIN CANCER EXCISION    . TONSILLECTOMY      Home Medications:  Allergies as of 07/17/2018      Reactions   Flomax [tamsulosin Hcl] Other (See Comments)   Dizziness      Medication List       Accurate as of July 17, 2018  8:51 AM. If you have any questions, ask your nurse or doctor.        aspirin 81 MG tablet Take 81 mg by mouth daily. Reported on 02/02/2015   CINNAMON PO Take by mouth daily.   docusate sodium 100 MG capsule Commonly known as: COLACE Take 100 mg by mouth 2 (two) times daily.   GARLIC PO Take by mouth.   lisinopril 20 MG tablet Commonly known as: ZESTRIL Take 20 mg by mouth daily.   multivitamin tablet Take 1 tablet by mouth daily.   pantoprazole 40 MG tablet Commonly known as: PROTONIX TK 1 T PO QD   sildenafil 50 MG tablet Commonly known as: VIAGRA TK 1 T PO PRN       Allergies:  Allergies  Allergen Reactions  . Flomax [Tamsulosin Hcl] Other (See Comments)    Dizziness    Family History: Family History  Problem Relation Age of Onset  . Heart disease Father   . Kidney disease Father        one kidney removed  .  Stroke Mother   . Prostate cancer Neg Hx     Social History:  reports that he quit smoking about 46 years ago. He has never used smokeless tobacco. He reports current alcohol use of about 21.0 standard drinks of alcohol per week. He reports that he does not use drugs.  ROS: UROLOGY Frequent Urination?: Yes Hard to postpone urination?: No Burning/pain with urination?: No Get up at night to urinate?: Yes Leakage of urine?: No Urine stream starts and stops?: No Trouble starting stream?: No Do you have to strain to urinate?: No Blood in urine?: No Urinary tract infection?: No Sexually transmitted disease?: No Injury to kidneys or bladder?: No Painful intercourse?: No Weak stream?: No Erection problems?: No Penile pain?: No   Gastrointestinal Nausea?: No Vomiting?: No Indigestion/heartburn?: No Diarrhea?: No Constipation?: No  Constitutional Fever: No Night sweats?: No Weight loss?: No Fatigue?: No  Skin Skin rash/lesions?: No Itching?: No  Eyes Blurred vision?: No Double vision?: No  Ears/Nose/Throat Sore throat?: No Sinus problems?: No  Hematologic/Lymphatic Swollen glands?: No Easy bruising?: No  Cardiovascular Leg swelling?: No Chest pain?: No  Respiratory Cough?: No Shortness of breath?: No  Endocrine Excessive thirst?: No  Musculoskeletal Back pain?: No Joint pain?: No  Neurological Headaches?: No Dizziness?: No  Psychologic Depression?: No Anxiety?: No  Physical Exam: BP 96/60 (BP Location: Left Arm, Patient Position: Sitting, Cuff Size: Normal)   Pulse 85   Ht 5\' 10"  (1.778 m)   Wt 180 lb (81.6 kg)   BMI 25.83 kg/m   Constitutional:  Well nourished. Alert and oriented, No acute distress. HEENT: [place] AT, moist mucus membranes.  Trachea midline, no masses. Cardiovascular: No clubbing, cyanosis, or edema. Respiratory: Normal respiratory effort, no increased work of breathing. Neurologic: Grossly intact, no focal deficits, moving all 4 extremities. Psychiatric: Normal mood and affect.   Laboratory Data: Lab Results  Component Value Date   WBC 6.9 10/18/2015   HGB 15.5 10/18/2015   HCT 44.0 10/18/2015   MCV 96.7 10/18/2015   PLT 242 10/18/2015    Lab Results  Component Value Date   CREATININE 0.96 07/07/2015    Component     Latest Ref Rng & Units 05/20/2012 01/04/2015 01/06/2015 05/10/2015  PSA     0.0 - 4.0 ng/mL 2.4 6.2 (H) 5.4 (H) 5.1 (H)   Component     Latest Ref Rng & Units 07/27/2015 04/07/2016 07/05/2016  PSA     0.0 - 4.0 ng/mL 5.2 (H) 1.72 1.8    PSA 1.06 in 10/2017 PSA 0.8 in 06/2017 PSA 1.04 in 11/2017 PSA 0.5 in 06/2018  I have reviewed the labs.  Pertinent Imaging: Now new interval imaging  Assessment & Plan:    1. Prostate  cancer [facility]) S/p brachytherapy on 10/2015 for prostate cancer progression with possible high grade lesion on MRI PSA with appropriate response Nocturia x 3 and frequency with minimal bother F/u with Dr. Baruch Gouty (radiation oncology) 11/2018   Return in about 1 year (around 07/17/2019) for PSA and office visit .  Zara Council, PA-C  [facility] [address], [place], [place] [address] [phone]

## 2018-07-17 ENCOUNTER — Encounter: Payer: Self-pay | Admitting: Urology

## 2018-07-17 ENCOUNTER — Ambulatory Visit: Payer: Medicare Other | Admitting: Urology

## 2018-07-17 ENCOUNTER — Other Ambulatory Visit: Payer: Self-pay

## 2018-07-17 VITALS — BP 96/60 | HR 85 | Ht 70.0 in | Wt 180.0 lb

## 2018-07-17 DIAGNOSIS — C61 Malignant neoplasm of prostate: Secondary | ICD-10-CM | POA: Diagnosis not present

## 2018-10-23 ENCOUNTER — Other Ambulatory Visit: Payer: Self-pay | Admitting: *Deleted

## 2018-10-23 DIAGNOSIS — Z20822 Contact with and (suspected) exposure to covid-19: Secondary | ICD-10-CM

## 2018-10-25 LAB — NOVEL CORONAVIRUS, NAA: SARS-CoV-2, NAA: NOT DETECTED

## 2018-11-20 ENCOUNTER — Inpatient Hospital Stay: Payer: Medicare Other | Attending: Radiation Oncology

## 2018-11-26 ENCOUNTER — Other Ambulatory Visit: Payer: Self-pay

## 2018-11-27 ENCOUNTER — Encounter: Payer: Self-pay | Admitting: Radiation Oncology

## 2018-11-27 ENCOUNTER — Ambulatory Visit
Admission: RE | Admit: 2018-11-27 | Discharge: 2018-11-27 | Disposition: A | Discharge status: Home / Self Care | Payer: Medicare Other | Source: Ambulatory Visit | Attending: Radiation Oncology | Admitting: Radiation Oncology

## 2018-11-27 ENCOUNTER — Other Ambulatory Visit: Payer: Medicare Other

## 2018-11-27 ENCOUNTER — Other Ambulatory Visit: Payer: Self-pay

## 2018-11-27 VITALS — BP 145/84 | HR 64 | Temp 98.4°F | Resp 16 | Wt 195.2 lb

## 2018-11-27 DIAGNOSIS — Z923 Personal history of irradiation: Secondary | ICD-10-CM | POA: Diagnosis not present

## 2018-11-27 DIAGNOSIS — K5900 Constipation, unspecified: Secondary | ICD-10-CM | POA: Diagnosis not present

## 2018-11-27 DIAGNOSIS — C61 Malignant neoplasm of prostate: Secondary | ICD-10-CM | POA: Diagnosis not present

## 2018-11-27 NOTE — Progress Notes (Signed)
Radiation Oncology Follow up Note  Name: Tony Mendez   Date:   11/27/2018 MRN:  [id number] DOB: October 31, 1940    This 78 y.o. male presents to the clinic today for 3-year follow-up status post I-125 interstitial implant for Gleason 6 adenocarcinoma the prostate.Marland Kitchen  REFERRING PROVIDER: Cletis Athens, MD  HPI: Patient is a 78 year old male now at 3 years having completed I-125 interstitial implant for Gleason 6 adenocarcinoma the prostate presenting with a PSA of 6.  He is seen today in routine follow-up and is doing well.  He specifically denies diarrhea dysuria or any other GI/GU complaints.  He does tend towards constipation does take Metamucil fairly frequently.Marland Kitchen  His most recent PSA back in June was 0.5 asked absolutely stable over the past year.  COMPLICATIONS OF TREATMENT: none  FOLLOW UP COMPLIANCE: keeps appointments   PHYSICAL EXAM:  BP (!) 145/84 (BP Location: Left Arm, Patient Position: Sitting)   Pulse 64   Temp 98.4 F (36.9 C) (Tympanic)   Resp 16   Wt 195 lb 3.2 oz (88.5 kg)   BMI 28.01 kg/m  Well-developed well-nourished patient in NAD. HEENT reveals PERLA, EOMI, discs not visualized.  Oral cavity is clear. No oral mucosal lesions are identified. Neck is clear without evidence of cervical or supraclavicular adenopathy. Lungs are clear to A&P. Cardiac examination is essentially unremarkable with regular rate and rhythm without murmur rub or thrill. Abdomen is benign with no organomegaly or masses noted. Motor sensory and DTR levels are equal and symmetric in the upper and lower extremities. Cranial nerves II through XII are grossly intact. Proprioception is intact. No peripheral adenopathy or edema is identified. No motor or sensory levels are noted. Crude visual fields are within normal range.  RADIOLOGY RESULTS: No current films for review  PLAN: Present time patient continues under excellent biochemical control of his prostate cancer.  Patient is requesting being  followed by urology only and I think at 3 years out that would be fine.  I will turn follow-up care over to urology.  Patient will have continued PSAs drawn through their department.  Patient knows to call anytime with any concerns.  I would like to take this opportunity to thank you for allowing me to participate in the care of your patient.Noreene Filbert, MD

## 2019-01-28 DIAGNOSIS — M1990 Unspecified osteoarthritis, unspecified site: Secondary | ICD-10-CM | POA: Diagnosis not present

## 2019-01-28 DIAGNOSIS — R0602 Shortness of breath: Secondary | ICD-10-CM | POA: Diagnosis not present

## 2019-01-29 DIAGNOSIS — E7849 Other hyperlipidemia: Secondary | ICD-10-CM | POA: Diagnosis not present

## 2019-01-29 DIAGNOSIS — R5381 Other malaise: Secondary | ICD-10-CM | POA: Diagnosis not present

## 2019-01-29 DIAGNOSIS — I10 Essential (primary) hypertension: Secondary | ICD-10-CM | POA: Diagnosis not present

## 2019-01-30 DIAGNOSIS — M1990 Unspecified osteoarthritis, unspecified site: Secondary | ICD-10-CM | POA: Diagnosis not present

## 2019-01-30 DIAGNOSIS — J309 Allergic rhinitis, unspecified: Secondary | ICD-10-CM | POA: Diagnosis not present

## 2019-02-10 DIAGNOSIS — M47816 Spondylosis without myelopathy or radiculopathy, lumbar region: Secondary | ICD-10-CM | POA: Diagnosis not present

## 2019-02-10 DIAGNOSIS — G8929 Other chronic pain: Secondary | ICD-10-CM | POA: Diagnosis not present

## 2019-02-10 DIAGNOSIS — M170 Bilateral primary osteoarthritis of knee: Secondary | ICD-10-CM | POA: Diagnosis not present

## 2019-02-10 DIAGNOSIS — M47815 Spondylosis without myelopathy or radiculopathy, thoracolumbar region: Secondary | ICD-10-CM | POA: Diagnosis not present

## 2019-02-10 DIAGNOSIS — M159 Polyosteoarthritis, unspecified: Secondary | ICD-10-CM | POA: Diagnosis not present

## 2019-02-10 DIAGNOSIS — M2550 Pain in unspecified joint: Secondary | ICD-10-CM | POA: Diagnosis not present

## 2019-02-10 DIAGNOSIS — M545 Low back pain, unspecified: Secondary | ICD-10-CM | POA: Insufficient documentation

## 2019-02-14 DIAGNOSIS — M47816 Spondylosis without myelopathy or radiculopathy, lumbar region: Secondary | ICD-10-CM | POA: Diagnosis not present

## 2019-02-14 DIAGNOSIS — M5137 Other intervertebral disc degeneration, lumbosacral region: Secondary | ICD-10-CM | POA: Diagnosis not present

## 2019-02-14 DIAGNOSIS — M419 Scoliosis, unspecified: Secondary | ICD-10-CM | POA: Diagnosis not present

## 2019-02-14 DIAGNOSIS — M25561 Pain in right knee: Secondary | ICD-10-CM | POA: Diagnosis not present

## 2019-02-14 DIAGNOSIS — M545 Low back pain: Secondary | ICD-10-CM | POA: Diagnosis not present

## 2019-02-19 DIAGNOSIS — M5136 Other intervertebral disc degeneration, lumbar region: Secondary | ICD-10-CM | POA: Diagnosis not present

## 2019-02-19 DIAGNOSIS — M47816 Spondylosis without myelopathy or radiculopathy, lumbar region: Secondary | ICD-10-CM | POA: Diagnosis not present

## 2019-03-21 ENCOUNTER — Ambulatory Visit: Payer: Medicare Other | Attending: Internal Medicine

## 2019-03-21 DIAGNOSIS — Z23 Encounter for immunization: Secondary | ICD-10-CM | POA: Insufficient documentation

## 2019-03-21 NOTE — Progress Notes (Signed)
   Covid-19 Vaccination Clinic  Name:  Tony Mendez    MRN: [id number] DOB: 08/30/1940  03/21/2019  Mr. Cam was observed post Covid-19 immunization for 15 minutes without incident. He was provided with Vaccine Information Sheet and instruction to access the V-Safe system.   Mr. Orders was instructed to call 911 with any severe reactions post vaccine: Marland Kitchen Difficulty breathing  . Swelling of face and throat  . A fast heartbeat  . A bad rash all over body  . Dizziness and weakness   Immunizations Administered    Name Date Dose VIS Date Route   Pfizer COVID-19 Vaccine 03/21/2019 10:32 AM 0.3 mL 12/27/2018 Intramuscular   Manufacturer: [place]   Lot: [id number]   [place]: [id number]

## 2019-04-11 ENCOUNTER — Other Ambulatory Visit: Payer: Self-pay

## 2019-04-11 ENCOUNTER — Ambulatory Visit: Payer: Medicare Other | Attending: Internal Medicine

## 2019-04-11 ENCOUNTER — Ambulatory Visit: Payer: Self-pay

## 2019-04-11 DIAGNOSIS — Z23 Encounter for immunization: Secondary | ICD-10-CM

## 2019-04-11 NOTE — Progress Notes (Signed)
   Covid-19 Vaccination Clinic  Name:  Tony Mendez    MRN: [id number] DOB: 24-Jul-1940  04/11/2019  [name] was observed post Covid-19 immunization for 15 minutes without incident. He was provided with Vaccine Information Sheet and instruction to access the V-Safe system.   [name] was instructed to call 911 with any severe reactions post vaccine: Marland Kitchen Difficulty breathing  . Swelling of face and throat  . A fast heartbeat  . A bad rash all over body  . Dizziness and weakness   Immunizations Administered    Name Date Dose VIS Date Route   Pfizer COVID-19 Vaccine 04/11/2019 10:00 AM 0.3 mL 12/27/2018 Intramuscular   Manufacturer: [place]   Lot: [id number]   [place]: [id number]

## 2019-05-23 ENCOUNTER — Other Ambulatory Visit: Payer: Self-pay | Admitting: Internal Medicine

## 2019-06-10 DIAGNOSIS — M542 Cervicalgia: Secondary | ICD-10-CM | POA: Insufficient documentation

## 2019-06-10 DIAGNOSIS — M8949 Other hypertrophic osteoarthropathy, multiple sites: Secondary | ICD-10-CM | POA: Diagnosis not present

## 2019-06-10 DIAGNOSIS — M159 Polyosteoarthritis, unspecified: Secondary | ICD-10-CM | POA: Insufficient documentation

## 2019-07-11 ENCOUNTER — Other Ambulatory Visit: Payer: Medicare Other

## 2019-07-18 ENCOUNTER — Ambulatory Visit: Payer: Medicare Other | Admitting: Urology

## 2019-07-21 DIAGNOSIS — L821 Other seborrheic keratosis: Secondary | ICD-10-CM | POA: Diagnosis not present

## 2019-07-21 DIAGNOSIS — L578 Other skin changes due to chronic exposure to nonionizing radiation: Secondary | ICD-10-CM | POA: Diagnosis not present

## 2019-07-21 DIAGNOSIS — Z872 Personal history of diseases of the skin and subcutaneous tissue: Secondary | ICD-10-CM | POA: Diagnosis not present

## 2019-07-21 DIAGNOSIS — Z859 Personal history of malignant neoplasm, unspecified: Secondary | ICD-10-CM | POA: Diagnosis not present

## 2019-07-21 DIAGNOSIS — L570 Actinic keratosis: Secondary | ICD-10-CM | POA: Diagnosis not present

## 2019-08-04 ENCOUNTER — Other Ambulatory Visit: Payer: Self-pay

## 2019-08-04 DIAGNOSIS — C61 Malignant neoplasm of prostate: Secondary | ICD-10-CM

## 2019-08-05 ENCOUNTER — Other Ambulatory Visit: Payer: Medicare Other

## 2019-08-05 ENCOUNTER — Other Ambulatory Visit: Payer: Self-pay

## 2019-08-05 DIAGNOSIS — C61 Malignant neoplasm of prostate: Secondary | ICD-10-CM

## 2019-08-06 LAB — PSA: Prostate Specific Ag, Serum: 0.3 ng/mL (ref 0.0–4.0)

## 2019-08-07 NOTE — Progress Notes (Signed)
08/08/2019 9:42 AM   Tony Mendez Dec 10, 1940 [id number]  Referring provider: Cletis Athens, MD [address],  [place] [address]  Chief Complaint  Patient presents with  . Prostate Cancer    HPI: Tony Mendez is a 80 y.o. male with prostate cancer who presents today for follow up.  Prostate cancer s/p brachytherapy on 11/01/15.   Initially dx with Gleason 3+3 prostate cancer previously on active surveillance who underwent prostate MRI which showed a concerning area at the right base for high grade carcinoma.  Due to concern for higher risk lesion, he elected to proceed with brachytherapy in 10/2015.    PSA trend as below.  Most recent PSA 0.3 on 07/2019.  BPH WITH LUTS  (prostate and/or bladder) IPSS score: 21/3     Major complaint(s): Frequency x several years and nocturia x 2-3 x several years.  Denies any dysuria, hematuria or suprapubic pain.   Denies any recent fevers, chills, nausea or vomiting.   IPSS    Row Name 08/08/19 0900         International Prostate Symptom Score   How often have you had the sensation of not emptying your bladder? More than half the time     How often have you had to urinate less than every two hours? Almost always     How often have you found you stopped and started again several times when you urinated? Less than half the time     How often have you found it difficult to postpone urination? Less than half the time     How often have you had a weak urinary stream? About half the time     How often have you had to strain to start urination? Less than half the time     How many times did you typically get up at night to urinate? 3 Times     Total IPSS Score 21       Quality of Life due to urinary symptoms   If you were to spend the rest of your life with your urinary condition just the way it is now how would you feel about that? Mixed            Score:  1-7 Mild 8-19 Moderate 20-35 Severe   Erectile  dysfunction SHIM score: 10     Risk factors:  age, pelvic radiation, BPH and prostate cancer No painful erections or curvatures with his erections.    No longer having spontaneous erections Tried:    sildenafil- is not currently sexual active    [place] Name 08/08/19 0919         SHIM: Over the last 6 months:   How do you rate your confidence that you could get and keep an erection? Low     When you had erections with sexual stimulation, how often were your erections hard enough for penetration (entering your partner)? A Few Times (much less than half the time)     During sexual intercourse, how often were you able to maintain your erection after you had penetrated (entered) your partner? A Few Times (much less than half the time)     During sexual intercourse, how difficult was it to maintain your erection to completion of intercourse? Very Difficult     When you attempted sexual intercourse, how often was it satisfactory for you? A Few Times (much less than half the time)  SHIM Total Score   SHIM 10            Score: 1-7 Severe ED 8-11 Moderate ED 12-16 Mild-Moderate ED 17-21 Mild ED 22-25 No ED  PMH: Past Medical History:  Diagnosis Date  . Arthritis   . BPH (benign prostatic hyperplasia)   . Cancer ([place])   . Elevated PSA 02/02/2015  . GERD (gastroesophageal reflux disease)   . Gout   . Motion sickness   . Nocturia   . Prostate cancer ([place])   . Prostatitis     Surgical History: Past Surgical History:  Procedure Laterality Date  . APPENDECTOMY    . COLONOSCOPY WITH PROPOFOL N/A 03/20/2016   Procedure: COLONOSCOPY WITH PROPOFOL;  Surgeon: Lucilla Lame, MD;  Location: [facility];  Service: Endoscopy;  Laterality: N/A;  . CYSTOSCOPY  11/01/2015   Procedure: CYSTOSCOPY;  Surgeon: Hollice Espy, MD;  Location: [facility];  Service: Urology;;  . POLYPECTOMY  03/20/2016   Procedure: POLYPECTOMY;  Surgeon: Lucilla Lame, MD;  Location: [facility];   Service: Endoscopy;;  . RADIOACTIVE SEED IMPLANT N/A 11/01/2015   Procedure: RADIOACTIVE SEED IMPLANT/BRACHYTHERAPY IMPLANT;  Surgeon: Hollice Espy, MD;  Location: [facility];  Service: Urology;  Laterality: N/A;  . SKIN CANCER EXCISION    . TONSILLECTOMY      Home Medications:  Allergies as of 08/08/2019      Reactions   Flomax [tamsulosin Hcl] Other (See Comments)   Dizziness      Medication List       Accurate as of August 08, 2019  9:42 AM. If you have any questions, ask your nurse or doctor.        acetaminophen 650 MG CR tablet Commonly known as: TYLENOL One tab  as needed every 8 hours for pain, 90 tabs   aspirin 81 MG tablet Take 81 mg by mouth daily. Reported on 02/02/2015   CINNAMON PO Take by mouth daily.   docusate sodium 100 MG capsule Commonly known as: COLACE Take 100 mg by mouth 2 (two) times daily.   GARLIC PO Take by mouth.   lisinopril 20 MG tablet Commonly known as: ZESTRIL TAKE 1 TABLET BY MOUTH DAILY   meloxicam 7.5 MG tablet Commonly known as: MOBIC Take 7.5 mg by mouth daily as needed.   multivitamin tablet Take 1 tablet by mouth daily.   pantoprazole 40 MG tablet Commonly known as: PROTONIX TK 1 T PO QD   sildenafil 50 MG tablet Commonly known as: VIAGRA TK 1 T PO PRN       Allergies:  Allergies  Allergen Reactions  . Flomax [Tamsulosin Hcl] Other (See Comments)    Dizziness    Family History: Family History  Problem Relation Age of Onset  . Heart disease Father   . Kidney disease Father        one kidney removed  . Stroke Mother   . Prostate cancer Neg Hx     Social History:  reports that he quit smoking about 47 years ago. He has never used smokeless tobacco. He reports current alcohol use of about 21.0 standard drinks of alcohol per week. He reports that he does not use drugs.  ROS: For pertinent review of systems please refer to history of present illness  Physical Exam: BP (!) 139/75   Pulse 59   Ht 5\' 10"   (1.778 m)   Wt 190 lb (86.2 kg)   BMI 27.26 kg/m   Constitutional:  Well nourished. Alert and  oriented, No acute distress. HEENT: [place] AT, mask in place.  Trachea midline Cardiovascular: No clubbing, cyanosis, or edema. Respiratory: Normal respiratory effort, no increased work of breathing. Neurologic: Grossly intact, no focal deficits, moving all 4 extremities. Psychiatric: Normal mood and affect.   Laboratory Data: Lab Results  Component Value Date   WBC 6.9 10/18/2015   HGB 15.5 10/18/2015   HCT 44.0 10/18/2015   MCV 96.7 10/18/2015   PLT 242 10/18/2015    Lab Results  Component Value Date   CREATININE 0.96 07/07/2015    Component     Latest Ref Rng & Units 05/20/2012  PSA     0.0 - 4.0 ng/mL 2.4   PSA 1.06 in 10/2017 PSA 1.04 in 11/2017 Component     Latest Ref Rng & Units 01/04/2015 01/06/2015 05/10/2015 07/27/2015  Prostate Specific Ag, Serum     0.0 - 4.0 ng/mL 6.2 (H) 5.4 (H) 5.1 (H) 5.2 (H)   Component     Latest Ref Rng & Units 07/05/2016 07/10/2017 07/15/2018 08/05/2019  Prostate Specific Ag, Serum     0.0 - 4.0 ng/mL 1.8 0.8 0.5 0.3   I have reviewed the labs.  Pertinent Imaging: Now new interval imaging  Assessment & Plan:    1. Prostate cancer [facility]) S/p brachytherapy on 10/2015 for prostate cancer progression with possible high grade lesion on MRI  PSA with appropriate response Nocturia x 3 and frequency with minimal bother RTC in one year for PSA  2. BPH with LUTS IPSS score is 21/3 Continue conservative management, avoiding bladder irritants and timed voiding's Most bothersome symptoms is/are frequency and nocturia, but he is not wanting to start medications to address this at this time RTC in 12 months for IPSS, PSA and exam   3. Erectile dysfunction SHIM score is 10 Continue Viagra RTC in 12 months for repeat SHIM score and exam    Return in about 1 year (around 08/07/2020) for IPSS, SHIM, PSA and exam.  Zara Council, [facility]  [place] [address], [place], [place] [address] [phone]

## 2019-08-08 ENCOUNTER — Other Ambulatory Visit: Payer: Self-pay

## 2019-08-08 ENCOUNTER — Ambulatory Visit (INDEPENDENT_AMBULATORY_CARE_PROVIDER_SITE_OTHER): Payer: Medicare Other | Admitting: Urology

## 2019-08-08 ENCOUNTER — Encounter: Payer: Self-pay | Admitting: Urology

## 2019-08-08 VITALS — BP 139/75 | HR 59 | Ht 70.0 in | Wt 190.0 lb

## 2019-08-08 DIAGNOSIS — N529 Male erectile dysfunction, unspecified: Secondary | ICD-10-CM | POA: Diagnosis not present

## 2019-08-08 DIAGNOSIS — R399 Unspecified symptoms and signs involving the genitourinary system: Secondary | ICD-10-CM

## 2019-08-08 DIAGNOSIS — C61 Malignant neoplasm of prostate: Secondary | ICD-10-CM

## 2019-08-20 DIAGNOSIS — L821 Other seborrheic keratosis: Secondary | ICD-10-CM | POA: Diagnosis not present

## 2019-08-20 DIAGNOSIS — L570 Actinic keratosis: Secondary | ICD-10-CM | POA: Diagnosis not present

## 2019-08-27 ENCOUNTER — Other Ambulatory Visit: Payer: Self-pay

## 2019-08-27 ENCOUNTER — Ambulatory Visit (INDEPENDENT_AMBULATORY_CARE_PROVIDER_SITE_OTHER): Payer: Medicare Other | Admitting: Internal Medicine

## 2019-08-27 VITALS — BP 130/76 | HR 80 | Temp 96.8°F | Ht 70.0 in | Wt 191.5 lb

## 2019-08-27 DIAGNOSIS — M545 Low back pain, unspecified: Secondary | ICD-10-CM

## 2019-08-27 DIAGNOSIS — J069 Acute upper respiratory infection, unspecified: Secondary | ICD-10-CM | POA: Insufficient documentation

## 2019-08-27 DIAGNOSIS — M542 Cervicalgia: Secondary | ICD-10-CM

## 2019-08-27 DIAGNOSIS — G8929 Other chronic pain: Secondary | ICD-10-CM | POA: Diagnosis not present

## 2019-08-27 DIAGNOSIS — Z0000 Encounter for general adult medical examination without abnormal findings: Secondary | ICD-10-CM

## 2019-08-27 NOTE — Progress Notes (Signed)
Established Patient Office Visit  SUBJECTIVE:  Subjective  Patient ID: Tony Mendez, male    DOB: 1940-12-25  Age: 79 y.o. MRN: [id number]  CC:  Chief Complaint  Patient presents with  . URI    Patient complains of cough, runny nose, and sinus pressure that started on Monday.    HPI Tony Mendez is a 79 y.o. male presenting today for an evaluation of an upper respiratory infection.  URI  This is a new problem. The current episode started in the past 7 days. The problem has been gradually worsening. There has been no fever. Associated symptoms include congestion, rhinorrhea and sinus pain. Pertinent negatives include no coughing, ear pain or headaches.     Past Medical History:  Diagnosis Date  . Arthritis   . BPH (benign prostatic hyperplasia)   . Cancer ([place])   . Elevated PSA 02/02/2015  . GERD (gastroesophageal reflux disease)   . Gout   . Motion sickness   . Nocturia   . Prostate cancer ([place])   . Prostatitis     Past Surgical History:  Procedure Laterality Date  . APPENDECTOMY    . COLONOSCOPY WITH PROPOFOL N/A 03/20/2016   Procedure: COLONOSCOPY WITH PROPOFOL;  Surgeon: Lucilla Lame, MD;  Location: [facility];  Service: Endoscopy;  Laterality: N/A;  . CYSTOSCOPY  11/01/2015   Procedure: CYSTOSCOPY;  Surgeon: Hollice Espy, MD;  Location: [facility];  Service: Urology;;  . POLYPECTOMY  03/20/2016   Procedure: POLYPECTOMY;  Surgeon: Lucilla Lame, MD;  Location: [facility];  Service: Endoscopy;;  . RADIOACTIVE SEED IMPLANT N/A 11/01/2015   Procedure: RADIOACTIVE SEED IMPLANT/BRACHYTHERAPY IMPLANT;  Surgeon: Hollice Espy, MD;  Location: [facility];  Service: Urology;  Laterality: N/A;  . SKIN CANCER EXCISION    . TONSILLECTOMY      Family History  Problem Relation Age of Onset  . Heart disease Father   . Kidney disease Father        one kidney removed  . Stroke Mother   . Prostate cancer Neg Hx     Social History   Socioeconomic  History  . Marital status: Divorced    Spouse name: Not on file  . Number of children: Not on file  . Years of education: Not on file  . Highest education level: Not on file  Occupational History  . Not on file  Tobacco Use  . Smoking status: Former Smoker    Quit date: 12/21/1971    Years since quitting: 47.7  . Smokeless tobacco: Never Used  Substance and Sexual Activity  . Alcohol use: Yes    Alcohol/week: 21.0 standard drinks    Types: 7 Glasses of wine, 14 Cans of beer per week    Comment: 23 oz a day  . Drug use: No  . Sexual activity: Not on file  Other Topics Concern  . Not on file  Social History Narrative  . Not on file   Social Determinants of Health   Financial Resource Strain:   . Difficulty of Paying Living Expenses:   Food Insecurity:   . Worried About Charity fundraiser in the Last Year:   . Arboriculturist in the Last Year:   Transportation Needs:   . Film/video editor (Medical):   Marland Kitchen Lack of Transportation (Non-Medical):   Physical Activity:   . Days of Exercise per Week:   . Minutes of Exercise per Session:   Stress:   . Feeling of  Stress :   Social Connections:   . Frequency of Communication with Friends and Family:   . Frequency of Social Gatherings with Friends and Family:   . Attends Religious Services:   . Active Member of Clubs or Organizations:   . Attends Archivist Meetings:   Marland Kitchen Marital Status:   Intimate Partner Violence:   . Fear of Current or Ex-Partner:   . Emotionally Abused:   Marland Kitchen Physically Abused:   . Sexually Abused:      Current Outpatient Medications:  .  acetaminophen (TYLENOL) 650 MG CR tablet, One tab  as needed every 8 hours for pain, 90 tabs, Disp: , Rfl:  .  aspirin 81 MG tablet, Take 81 mg by mouth daily. Reported on 02/02/2015, Disp: , Rfl:  .  CINNAMON PO, Take by mouth daily., Disp: , Rfl:  .  docusate sodium (COLACE) 100 MG capsule, Take 100 mg by mouth 2 (two) times daily., Disp: , Rfl:  .   GARLIC PO, Take by mouth., Disp: , Rfl:  .  lisinopril (ZESTRIL) 20 MG tablet, TAKE 1 TABLET BY MOUTH DAILY, Disp: 90 tablet, Rfl: 3 .  meloxicam (MOBIC) 7.5 MG tablet, Take 7.5 mg by mouth daily as needed., Disp: , Rfl:  .  Multiple Vitamin (MULTIVITAMIN) tablet, Take 1 tablet by mouth daily., Disp: , Rfl:  .  pantoprazole (PROTONIX) 40 MG tablet, TK 1 T PO QD, Disp: , Rfl:  .  sildenafil (VIAGRA) 50 MG tablet, TK 1 T PO PRN, Disp: , Rfl:    Allergies  Allergen Reactions  . Flomax [Tamsulosin Hcl] Other (See Comments)    Dizziness    ROS Review of Systems  Constitutional: Positive for fatigue. Negative for fever.  HENT: Positive for congestion, postnasal drip, rhinorrhea, sinus pressure and sinus pain. Negative for ear discharge and ear pain.   Eyes: Negative.   Respiratory: Negative.  Negative for cough.   Cardiovascular: Negative.   Gastrointestinal: Negative.   Endocrine: Negative.   Genitourinary: Negative.   Musculoskeletal: Negative.   Skin: Negative.   Allergic/Immunologic: Positive for environmental allergies.  Neurological: Negative.  Negative for headaches.  Hematological: Negative.   Psychiatric/Behavioral: Negative.   All other systems reviewed and are negative.    OBJECTIVE:    Physical Exam Vitals reviewed.  Constitutional:      Appearance: Normal appearance.  HENT:     Mouth/Throat:     Mouth: Mucous membranes are moist.  Eyes:     Pupils: Pupils are equal, round, and reactive to light.  Neck:     Vascular: No carotid bruit.  Cardiovascular:     Rate and Rhythm: Normal rate and regular rhythm.     Pulses: Normal pulses.     Heart sounds: Normal heart sounds.  Pulmonary:     Effort: Pulmonary effort is normal.     Breath sounds: Normal breath sounds.  Abdominal:     General: Bowel sounds are normal.     Palpations: Abdomen is soft. There is no hepatomegaly, splenomegaly or mass.     Tenderness: There is no abdominal tenderness.     Hernia: No  hernia is present.  Musculoskeletal:     Cervical back: Neck supple.     Right lower leg: No edema.     Left lower leg: No edema.  Skin:    Findings: No rash.  Neurological:     Mental Status: He is alert and oriented to person, place, and time.  Motor: No weakness.  Psychiatric:        Mood and Affect: Mood normal.        Behavior: Behavior normal.     BP 130/76   Pulse 80   Temp (!) 96.8 F (36 C) (Temporal)   Ht 5\' 10"  (1.778 m)   Wt 191 lb 8 oz (86.9 kg)   BMI 27.48 kg/m  Wt Readings from Last 3 Encounters:  08/27/19 191 lb 8 oz (86.9 kg)  08/08/19 190 lb (86.2 kg)  11/27/18 195 lb 3.2 oz (88.5 kg)    Health Maintenance Due  Topic Date Due  . Hepatitis C Screening  Never done  . TETANUS/TDAP  Never done  . PNA vac Low Risk Adult (1 of 2 - PCV13) Never done  . COLONOSCOPY  03/21/2019  . INFLUENZA VACCINE  08/17/2019    There are no preventive care reminders to display for this patient.  CBC Latest Ref Rng & Units 10/18/2015 02/13/2015  WBC 3.8 - 10.6 K/uL 6.9 8.1  Hemoglobin 13.0 - 18.0 g/dL 15.5 16.4  Hematocrit 40 - 52 % 44.0 47.6  Platelets 150 - 440 K/uL 242 277   CMP Latest Ref Rng & Units 07/07/2015 02/13/2015  Glucose 65 - 99 mg/dL - 120(H)  BUN 6 - 20 mg/dL - 11  Creatinine 0.61 - 1.24 mg/dL 0.96 0.92  Sodium 135 - 145 mmol/L - 142  Potassium 3.5 - 5.1 mmol/L - 4.4  Chloride 101 - 111 mmol/L - 107  CO2 22 - 32 mmol/L - 26  Calcium 8.9 - 10.3 mg/dL - 9.3  Total Protein 6.5 - 8.1 g/dL - 7.4  Total Bilirubin 0.3 - 1.2 mg/dL - 1.4(H)  Alkaline Phos 38 - 126 U/L - 58  AST 15 - 41 U/L - 27  ALT 17 - 63 U/L - 22    No results found for: TSH Lab Results  Component Value Date   ALBUMIN 4.2 02/13/2015   ANIONGAP 9 02/13/2015   No results found for: CHOL, HDL, LDLCALC, CHOLHDL No results found for: TRIG No results found for: HGBA1C    ASSESSMENT & PLAN:   Problem List Items Addressed This Visit      Respiratory   URI, acute - Primary     Patient was given Zithromax 250 mg 2 tablets p.o. daily for 3 days        Other   Chronic midline low back pain without sciatica    Patient was advised to do back exercises.  Relieve the back stiffness.  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  He does not have any evidence of radiculopathy.      Encounter for general health examination    Patient BMI is slightly high so he was advised not to eat more carbohydrates.  He does not seem to be depressed.  He has a low fall risk.  He has received Covid shot.  Make follow-up for his PSA.  He also has a history of colonic polyps so he needs to schedule a physical.         No orders of the defined types were placed in this encounter.     Follow-up: No follow-ups on file.    Dr. Jane Canary [facility] [address], [place], [place] [address]   By signing my name  below, I, [employer], attest that this documentation has been prepared under the direction and in the presence of Cletis Athens, MD. Electronically Signed: Cletis Athens, MD 08/27/19, 2:23 PM   I personally performed the services described in this documentation, which was SCRIBED in my presence. The recorded information has been reviewed and considered accurate. It has been edited as necessary during review. Cletis Athens, MD

## 2019-08-27 NOTE — Assessment & Plan Note (Signed)
Patient BMI is slightly high so he was advised not to eat more carbohydrates.  He does not seem to be depressed.  He has a low fall risk.  He has received Covid shot.  Make follow-up for his PSA.  He also has a history of colonic polyps so he needs to schedule a physical.

## 2019-08-27 NOTE — Assessment & Plan Note (Signed)
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  He does not have any evidence of radiculopathy.

## 2019-08-27 NOTE — Assessment & Plan Note (Signed)
Patient was advised to do back exercises.  Relieve the back stiffness.  Was also advised to come back for a complete blood test when he is better

## 2019-08-27 NOTE — Assessment & Plan Note (Signed)
Patient was given Zithromax 250 mg 2 tablets p.o. daily for 3 days

## 2019-08-28 ENCOUNTER — Other Ambulatory Visit: Payer: Self-pay

## 2019-08-28 MED ORDER — AZITHROMYCIN 250 MG PO TABS: ORAL_TABLET | ORAL | 0 refills | Status: DC

## 2019-09-24 DIAGNOSIS — L821 Other seborrheic keratosis: Secondary | ICD-10-CM | POA: Diagnosis not present

## 2019-09-24 DIAGNOSIS — L570 Actinic keratosis: Secondary | ICD-10-CM | POA: Diagnosis not present

## 2019-11-28 DIAGNOSIS — M1711 Unilateral primary osteoarthritis, right knee: Secondary | ICD-10-CM | POA: Diagnosis not present

## 2019-11-28 DIAGNOSIS — M25562 Pain in left knee: Secondary | ICD-10-CM | POA: Diagnosis not present

## 2019-11-28 DIAGNOSIS — M1712 Unilateral primary osteoarthritis, left knee: Secondary | ICD-10-CM | POA: Diagnosis not present

## 2019-11-28 DIAGNOSIS — M25561 Pain in right knee: Secondary | ICD-10-CM | POA: Diagnosis not present

## 2019-11-28 DIAGNOSIS — G8929 Other chronic pain: Secondary | ICD-10-CM | POA: Diagnosis not present

## 2020-01-27 ENCOUNTER — Other Ambulatory Visit: Payer: Self-pay | Admitting: Internal Medicine

## 2020-01-30 DIAGNOSIS — M25562 Pain in left knee: Secondary | ICD-10-CM | POA: Diagnosis not present

## 2020-01-30 DIAGNOSIS — M170 Bilateral primary osteoarthritis of knee: Secondary | ICD-10-CM | POA: Diagnosis not present

## 2020-01-30 DIAGNOSIS — M25561 Pain in right knee: Secondary | ICD-10-CM | POA: Diagnosis not present

## 2020-01-30 DIAGNOSIS — G8929 Other chronic pain: Secondary | ICD-10-CM | POA: Diagnosis not present

## 2020-02-16 ENCOUNTER — Other Ambulatory Visit: Payer: Self-pay | Admitting: Internal Medicine

## 2020-03-18 ENCOUNTER — Encounter: Payer: Self-pay | Admitting: Family Medicine

## 2020-03-18 ENCOUNTER — Other Ambulatory Visit: Payer: Self-pay

## 2020-03-18 ENCOUNTER — Ambulatory Visit (INDEPENDENT_AMBULATORY_CARE_PROVIDER_SITE_OTHER): Payer: Medicare Other | Admitting: Family Medicine

## 2020-03-18 VITALS — BP 145/79 | HR 73 | Ht 70.0 in | Wt 195.2 lb

## 2020-03-18 DIAGNOSIS — R0981 Nasal congestion: Secondary | ICD-10-CM | POA: Diagnosis not present

## 2020-03-18 DIAGNOSIS — Z20822 Contact with and (suspected) exposure to covid-19: Secondary | ICD-10-CM

## 2020-03-18 LAB — POC COVID19 BINAXNOW: SARS Coronavirus 2 Ag: NEGATIVE

## 2020-03-18 MED ORDER — AZITHROMYCIN 250 MG PO TABS: ORAL_TABLET | ORAL | 0 refills | Status: DC

## 2020-03-18 NOTE — Assessment & Plan Note (Signed)
Patient with sinus congestion and cough for 4 days, he was negative for covid today. He is taking an antihistamine.   Plan- He is adamant about wanting an abx that he had last year when he had the same sx. I have urged OTC Flonase and he can take the abx 5 days from today if not feeling better.

## 2020-03-18 NOTE — Progress Notes (Signed)
Established Patient Office Visit  SUBJECTIVE:  Subjective  Patient ID: Tony Mendez, male    DOB: 05-13-1940  Age: 80 y.o. MRN: [id number]  CC:  Chief Complaint  Patient presents with  . Nasal Congestion    HPI Tony Mendez is a 80 y.o. male presenting today for     Past Medical History:  Diagnosis Date  . Arthritis   . BPH (benign prostatic hyperplasia)   . Cancer ([place])   . Elevated PSA 02/02/2015  . GERD (gastroesophageal reflux disease)   . Gout   . Motion sickness   . Nocturia   . Prostate cancer ([place])   . Prostatitis     Past Surgical History:  Procedure Laterality Date  . APPENDECTOMY    . COLONOSCOPY WITH PROPOFOL N/A 03/20/2016   Procedure: COLONOSCOPY WITH PROPOFOL;  Surgeon: Lucilla Lame, MD;  Location: [place];  Service: Endoscopy;  Laterality: N/A;  . CYSTOSCOPY  11/01/2015   Procedure: CYSTOSCOPY;  Surgeon: Hollice Espy, MD;  Location: [facility];  Service: Urology;;  . POLYPECTOMY  03/20/2016   Procedure: POLYPECTOMY;  Surgeon: Lucilla Lame, MD;  Location: [place];  Service: Endoscopy;;  . RADIOACTIVE SEED IMPLANT N/A 11/01/2015   Procedure: RADIOACTIVE SEED IMPLANT/BRACHYTHERAPY IMPLANT;  Surgeon: Hollice Espy, MD;  Location: [facility];  Service: Urology;  Laterality: N/A;  . SKIN CANCER EXCISION    . TONSILLECTOMY      Family History  Problem Relation Age of Onset  . Heart disease Father   . Kidney disease Father        one kidney removed  . Stroke Mother   . Prostate cancer Neg Hx     Social History   Socioeconomic History  . Marital status: Divorced    Spouse name: Not on file  . Number of children: Not on file  . Years of education: Not on file  . Highest education level: Not on file  Occupational History  . Not on file  Tobacco Use  . Smoking status: Former Smoker    Quit date: 12/21/1971    Years since quitting: 48.2  . Smokeless tobacco: Never Used  Substance and Sexual Activity  . Alcohol use:  Yes    Alcohol/week: 21.0 standard drinks    Types: 7 Glasses of wine, 14 Cans of beer per week    Comment: 23 oz a day  . Drug use: No  . Sexual activity: Not on file  Other Topics Concern  . Not on file  Social History Narrative  . Not on file   Social Determinants of Health   Financial Resource Strain: Not on file  Food Insecurity: Not on file  Transportation Needs: Not on file  Physical Activity: Not on file  Stress: Not on file  Social Connections: Not on file  Intimate Partner Violence: Not on file     Current Outpatient Medications:  .  acetaminophen (TYLENOL) 650 MG CR tablet, One tab  as needed every 8 hours for pain, 90 tabs, Disp: , Rfl:  .  aspirin 81 MG tablet, Take 81 mg by mouth daily. Reported on 02/02/2015, Disp: , Rfl:  .  azithromycin (ZITHROMAX) 250 MG tablet, Use as directed, Disp: 6 tablet, Rfl: 0 .  azithromycin (ZITHROMAX) 250 MG tablet, Take 2 today then 1 tab daily x 4 days., Disp: 6 tablet, Rfl: 0 .  CINNAMON PO, Take by mouth daily., Disp: , Rfl:  .  docusate sodium (COLACE) 100 MG capsule, Take 100  mg by mouth 2 (two) times daily., Disp: , Rfl:  .  GARLIC PO, Take by mouth., Disp: , Rfl:  .  lisinopril (ZESTRIL) 20 MG tablet, TAKE 1 TABLET BY MOUTH DAILY, Disp: 90 tablet, Rfl: 3 .  meloxicam (MOBIC) 7.5 MG tablet, Take 7.5 mg by mouth daily as needed., Disp: , Rfl:  .  Multiple Vitamin (MULTIVITAMIN) tablet, Take 1 tablet by mouth daily., Disp: , Rfl:  .  pantoprazole (PROTONIX) 40 MG tablet, TAKE 1 TABLET BY MOUTH DAILY, Disp: 90 tablet, Rfl: 3 .  sildenafil (VIAGRA) 50 MG tablet, TK 1 T PO PRN, Disp: , Rfl:    Allergies  Allergen Reactions  . Flomax [Tamsulosin Hcl] Other (See Comments)    Dizziness    ROS Review of Systems  Constitutional: Positive for fatigue.  HENT: Positive for postnasal drip, sinus pressure and sore throat.   Respiratory: Negative.   Cardiovascular: Negative.   Genitourinary: Negative.   Musculoskeletal: Negative.    Neurological: Negative.   Psychiatric/Behavioral: Negative.      OBJECTIVE:    Physical Exam Vitals and nursing note reviewed.  Constitutional:      Appearance: Normal appearance.  HENT:     Head: Normocephalic.     Mouth/Throat:     Mouth: Mucous membranes are moist.  Cardiovascular:     Rate and Rhythm: Normal rate and regular rhythm.  Pulmonary:     Effort: Pulmonary effort is normal.  Musculoskeletal:        General: Normal range of motion.  Neurological:     General: No focal deficit present.  Psychiatric:        Mood and Affect: Mood normal.     BP (!) 145/79   Pulse 73   Ht 5\' 10"  (1.778 m)   Wt 195 lb 3.2 oz (88.5 kg)   BMI 28.01 kg/m  Wt Readings from Last 3 Encounters:  03/18/20 195 lb 3.2 oz (88.5 kg)  08/27/19 191 lb 8 oz (86.9 kg)  08/08/19 190 lb (86.2 kg)    Health Maintenance Due  Topic Date Due  . Hepatitis C Screening  Never done  . TETANUS/TDAP  Never done  . PNA vac Low Risk Adult (1 of 2 - PCV13) Never done  . COLONOSCOPY (Pts 45-7yrs Insurance coverage will need to be confirmed)  03/21/2019  . COVID-19 Vaccine (3 - Pfizer risk 4-dose series) 05/09/2019  . INFLUENZA VACCINE  08/17/2019    There are no preventive care reminders to display for this patient.  CBC Latest Ref Rng & Units 10/18/2015 02/13/2015  WBC 3.8 - 10.6 K/uL 6.9 8.1  Hemoglobin 13.0 - 18.0 g/dL 15.5 16.4  Hematocrit 40.0 - 52.0 % 44.0 47.6  Platelets 150 - 440 K/uL 242 277   CMP Latest Ref Rng & Units 07/07/2015 02/13/2015  Glucose 65 - 99 mg/dL - 120(H)  BUN 6 - 20 mg/dL - 11  Creatinine 0.61 - 1.24 mg/dL 0.96 0.92  Sodium 135 - 145 mmol/L - 142  Potassium 3.5 - 5.1 mmol/L - 4.4  Chloride 101 - 111 mmol/L - 107  CO2 22 - 32 mmol/L - 26  Calcium 8.9 - 10.3 mg/dL - 9.3  Total Protein 6.5 - 8.1 g/dL - 7.4  Total Bilirubin 0.3 - 1.2 mg/dL - 1.4(H)  Alkaline Phos 38 - 126 U/L - 58  AST 15 - 41 U/L - 27  ALT 17 - 63 U/L - 22    No results found for: TSH Lab  Results  Component Value Date   ALBUMIN 4.2 02/13/2015   ANIONGAP 9 02/13/2015   No results found for: CHOL, HDL, LDLCALC, CHOLHDL No results found for: TRIG No results found for: HGBA1C    ASSESSMENT & PLAN:   Problem List Items Addressed This Visit      Respiratory   Sinus congestion    Patient with sinus congestion and cough for 4 days, he was negative for covid today. He is taking an antihistamine.   Plan- He is adamant about wanting an abx that he had last year when he had the same sx. I have urged OTC Flonase and he can take the abx 5 days from today if not feeling better.         Other Visit Diagnoses    Suspected COVID-19 virus infection    -  Primary   Relevant Orders   POC COVID-19 (Completed)      Meds ordered this encounter  Medications  . azithromycin (ZITHROMAX) 250 MG tablet    Sig: Take 2 today then 1 tab daily x 4 days.    Dispense:  6 tablet    Refill:  0      Follow-up: No follow-ups on file.    Beckie Salts, [place] [address] [place], [place] [address]

## 2020-05-12 ENCOUNTER — Other Ambulatory Visit: Payer: Self-pay | Admitting: Internal Medicine

## 2020-05-17 ENCOUNTER — Other Ambulatory Visit: Payer: Self-pay | Admitting: Internal Medicine

## 2020-06-03 DIAGNOSIS — L570 Actinic keratosis: Secondary | ICD-10-CM | POA: Diagnosis not present

## 2020-06-03 DIAGNOSIS — B351 Tinea unguium: Secondary | ICD-10-CM | POA: Diagnosis not present

## 2020-06-03 DIAGNOSIS — L821 Other seborrheic keratosis: Secondary | ICD-10-CM | POA: Diagnosis not present

## 2020-07-01 ENCOUNTER — Other Ambulatory Visit: Payer: Self-pay

## 2020-07-01 ENCOUNTER — Other Ambulatory Visit (INDEPENDENT_AMBULATORY_CARE_PROVIDER_SITE_OTHER): Payer: Medicare Other

## 2020-07-01 DIAGNOSIS — U071 COVID-19: Secondary | ICD-10-CM | POA: Diagnosis not present

## 2020-07-01 LAB — POC COVID19 BINAXNOW: SARS Coronavirus 2 Ag: POSITIVE — AB

## 2020-07-01 NOTE — Progress Notes (Signed)
[place]

## 2020-08-09 ENCOUNTER — Other Ambulatory Visit: Payer: Self-pay

## 2020-08-10 ENCOUNTER — Other Ambulatory Visit: Payer: Self-pay

## 2020-08-10 ENCOUNTER — Ambulatory Visit (INDEPENDENT_AMBULATORY_CARE_PROVIDER_SITE_OTHER): Payer: Medicare Other | Admitting: Internal Medicine

## 2020-08-10 DIAGNOSIS — Z0000 Encounter for general adult medical examination without abnormal findings: Secondary | ICD-10-CM

## 2020-08-10 DIAGNOSIS — R972 Elevated prostate specific antigen [PSA]: Secondary | ICD-10-CM

## 2020-08-11 LAB — CBC WITH DIFFERENTIAL/PLATELET
Absolute Monocytes: 502 cells/uL (ref 200–950)
Basophils Absolute: 68 cells/uL (ref 0–200)
Basophils Relative: 1.1 %
Eosinophils Absolute: 298 cells/uL (ref 15–500)
Eosinophils Relative: 4.8 %
HCT: 46.6 % (ref 38.5–50.0)
Hemoglobin: 15.8 g/dL (ref 13.2–17.1)
Lymphs Abs: 1469 cells/uL (ref 850–3900)
MCH: 33.3 pg — ABNORMAL HIGH (ref 27.0–33.0)
MCHC: 33.9 g/dL (ref 32.0–36.0)
MCV: 98.3 fL (ref 80.0–100.0)
MPV: 9.7 fL (ref 7.5–12.5)
Monocytes Relative: 8.1 %
Neutro Abs: 3863 cells/uL (ref 1500–7800)
Neutrophils Relative %: 62.3 %
Platelets: 294 10*3/uL (ref 140–400)
RBC: 4.74 10*6/uL (ref 4.20–5.80)
RDW: 12.7 % (ref 11.0–15.0)
Total Lymphocyte: 23.7 %
WBC: 6.2 10*3/uL (ref 3.8–10.8)

## 2020-08-11 LAB — COMPLETE METABOLIC PANEL WITH GFR
AG Ratio: 1.6 (calc) (ref 1.0–2.5)
ALT: 18 U/L (ref 9–46)
AST: 22 U/L (ref 10–35)
Albumin: 4.1 g/dL (ref 3.6–5.1)
Alkaline phosphatase (APISO): 53 U/L (ref 35–144)
BUN: 14 mg/dL (ref 7–25)
CO2: 24 mmol/L (ref 20–32)
Calcium: 9.3 mg/dL (ref 8.6–10.3)
Chloride: 104 mmol/L (ref 98–110)
Creat: 1.21 mg/dL (ref 0.70–1.28)
Globulin: 2.5 g/dL (calc) (ref 1.9–3.7)
Glucose, Bld: 95 mg/dL (ref 65–99)
Potassium: 4.6 mmol/L (ref 3.5–5.3)
Sodium: 139 mmol/L (ref 135–146)
Total Bilirubin: 1 mg/dL (ref 0.2–1.2)
Total Protein: 6.6 g/dL (ref 6.1–8.1)
eGFR: 62 mL/min/{1.73_m2} (ref >=60)

## 2020-08-11 LAB — LIPID PANEL
Cholesterol: 203 mg/dL — ABNORMAL HIGH (ref <200)
HDL: 53 mg/dL (ref >=40)
LDL Cholesterol (Calc): 123 mg/dL (calc) — ABNORMAL HIGH
Non-HDL Cholesterol (Calc): 150 mg/dL (calc) — ABNORMAL HIGH (ref <130)
Total CHOL/HDL Ratio: 3.8 (calc) (ref <5.0)
Triglycerides: 157 mg/dL — ABNORMAL HIGH (ref <150)

## 2020-08-11 LAB — TSH: TSH: 1.37 mIU/L (ref 0.40–4.50)

## 2020-08-11 LAB — PSA: PSA: 0.19 ng/mL (ref <=4.00)

## 2020-08-13 ENCOUNTER — Ambulatory Visit: Payer: Self-pay | Admitting: Urology

## 2020-08-16 ENCOUNTER — Other Ambulatory Visit: Payer: Self-pay

## 2020-08-16 ENCOUNTER — Encounter: Payer: Self-pay | Admitting: Internal Medicine

## 2020-08-16 ENCOUNTER — Ambulatory Visit (INDEPENDENT_AMBULATORY_CARE_PROVIDER_SITE_OTHER): Payer: Medicare Other | Admitting: Internal Medicine

## 2020-08-16 VITALS — BP 137/81 | HR 67 | Ht 70.0 in | Wt 193.3 lb

## 2020-08-16 DIAGNOSIS — G8929 Other chronic pain: Secondary | ICD-10-CM | POA: Diagnosis not present

## 2020-08-16 DIAGNOSIS — Z0000 Encounter for general adult medical examination without abnormal findings: Secondary | ICD-10-CM

## 2020-08-16 DIAGNOSIS — I10 Essential (primary) hypertension: Secondary | ICD-10-CM | POA: Diagnosis not present

## 2020-08-16 DIAGNOSIS — M545 Low back pain, unspecified: Secondary | ICD-10-CM | POA: Diagnosis not present

## 2020-08-16 DIAGNOSIS — M542 Cervicalgia: Secondary | ICD-10-CM

## 2020-08-16 DIAGNOSIS — R972 Elevated prostate specific antigen [PSA]: Secondary | ICD-10-CM

## 2020-08-16 NOTE — Assessment & Plan Note (Signed)
-   Patient's back pain is under control with medication.  - Encouraged the patient to stretch or do yoga as able to help with back pain 

## 2020-08-16 NOTE — Assessment & Plan Note (Signed)
Stable no radiculopathy

## 2020-08-16 NOTE — Assessment & Plan Note (Signed)
Patient has a cancer of the prostate we will check PSA.  He does not want rectal examination.

## 2020-08-16 NOTE — Assessment & Plan Note (Signed)
Patient complains of shortness of breath with exertion we will do a stress test.  Lab data were discussed with the patient.

## 2020-08-16 NOTE — Progress Notes (Signed)
Established Patient Office Visit  Subjective:  Patient ID: Tony Mendez, male    DOB: Aug 05, 1940  Age: 80 y.o. MRN: [id number]  CC:  Chief Complaint  Patient presents with   Annual Exam    HPI  Tony Mendez presents for physical  Past Medical History:  Diagnosis Date   Arthritis    BPH (benign prostatic hyperplasia)    Cancer ([place])    Elevated PSA 02/02/2015   GERD (gastroesophageal reflux disease)    Gout    Motion sickness    Nocturia    Prostate cancer ([place])    Prostatitis     Past Surgical History:  Procedure Laterality Date   APPENDECTOMY     COLONOSCOPY WITH PROPOFOL N/A 03/20/2016   Procedure: COLONOSCOPY WITH PROPOFOL;  Surgeon: Lucilla Lame, MD;  Location: [place];  Service: Endoscopy;  Laterality: N/A;   CYSTOSCOPY  11/01/2015   Procedure: CYSTOSCOPY;  Surgeon: Hollice Espy, MD;  Location: [facility];  Service: Urology;;   POLYPECTOMY  03/20/2016   Procedure: POLYPECTOMY;  Surgeon: Lucilla Lame, MD;  Location: [place];  Service: Endoscopy;;   RADIOACTIVE SEED IMPLANT N/A 11/01/2015   Procedure: RADIOACTIVE SEED IMPLANT/BRACHYTHERAPY IMPLANT;  Surgeon: Hollice Espy, MD;  Location: [facility];  Service: Urology;  Laterality: N/A;   SKIN CANCER EXCISION     TONSILLECTOMY      Family History  Problem Relation Age of Onset   Heart disease Father    Kidney disease Father        one kidney removed   Stroke Mother    Prostate cancer Neg Hx     Social History   Socioeconomic History   Marital status: Divorced    Spouse name: Not on file   Number of children: Not on file   Years of education: Not on file   Highest education level: Not on file  Occupational History   Not on file  Tobacco Use   Smoking status: Former    Types: Cigarettes    Quit date: 12/21/1971    Years since quitting: 48.6   Smokeless tobacco: Never  Substance and Sexual Activity   Alcohol use: Yes    Alcohol/week: 21.0 standard drinks    Types: 7  Glasses of wine, 14 Cans of beer per week    Comment: 23 oz a day   Drug use: No   Sexual activity: Not on file  Other Topics Concern   Not on file  Social History Narrative   Not on file   Social Determinants of Health   Financial Resource Strain: Not on file  Food Insecurity: Not on file  Transportation Needs: Not on file  Physical Activity: Not on file  Stress: Not on file  Social Connections: Not on file  Intimate Partner Violence: Not on file     Current Outpatient Medications:    acetaminophen (TYLENOL) 650 MG CR tablet, One tab  as needed every 8 hours for pain, 90 tabs, Disp: , Rfl:    aspirin 81 MG tablet, Take 81 mg by mouth daily. Reported on 02/02/2015, Disp: , Rfl:    CINNAMON PO, Take by mouth daily., Disp: , Rfl:    docusate sodium (COLACE) 100 MG capsule, Take 100 mg by mouth 2 (two) times daily., Disp: , Rfl:    GARLIC PO, Take by mouth., Disp: , Rfl:    lisinopril (ZESTRIL) 20 MG tablet, TAKE 1 TABLET BY MOUTH DAILY, Disp: 90 tablet, Rfl: 3   meloxicam (  MOBIC) 7.5 MG tablet, TAKE 1 TABLET BY MOUTH DAILY, Disp: 30 tablet, Rfl: 3   Multiple Vitamin (MULTIVITAMIN) tablet, Take 1 tablet by mouth daily., Disp: , Rfl:    pantoprazole (PROTONIX) 40 MG tablet, TAKE 1 TABLET BY MOUTH DAILY, Disp: 90 tablet, Rfl: 3   sildenafil (VIAGRA) 50 MG tablet, TK 1 T PO PRN, Disp: , Rfl:    Allergies  Allergen Reactions   Flomax [Tamsulosin Hcl] Other (See Comments)    Dizziness    ROS Review of Systems  Constitutional: Negative.   HENT: Negative.    Eyes: Negative.   Respiratory: Negative.    Cardiovascular: Negative.   Gastrointestinal: Negative.   Endocrine: Negative.   Genitourinary: Negative.   Musculoskeletal: Negative.   Skin: Negative.   Allergic/Immunologic: Negative.   Neurological: Negative.   Hematological: Negative.   Psychiatric/Behavioral: Negative.    All other systems reviewed and are negative.    Objective:    Physical Exam Vitals reviewed.   Constitutional:      Appearance: Normal appearance.  HENT:     Mouth/Throat:     Mouth: Mucous membranes are moist.  Eyes:     Pupils: Pupils are equal, round, and reactive to light.  Neck:     Vascular: No carotid bruit.  Cardiovascular:     Rate and Rhythm: Normal rate and regular rhythm.     Pulses: Normal pulses.     Heart sounds: Normal heart sounds.  Pulmonary:     Effort: Pulmonary effort is normal.     Breath sounds: Normal breath sounds.  Abdominal:     General: Bowel sounds are normal.     Palpations: Abdomen is soft. There is no hepatomegaly, splenomegaly or mass.     Tenderness: There is no abdominal tenderness.     Hernia: No hernia is present.  Musculoskeletal:     Cervical back: Neck supple.     Right lower leg: No edema.     Left lower leg: No edema.  Skin:    Findings: No rash.  Neurological:     Mental Status: He is alert and oriented to person, place, and time.     Motor: No weakness.  Psychiatric:        Mood and Affect: Mood normal.        Behavior: Behavior normal.    BP 137/81   Pulse 67   Ht 5' 10"  (1.778 m)   Wt 193 lb 4.8 oz (87.7 kg)   BMI 27.74 kg/m  Wt Readings from Last 3 Encounters:  08/16/20 193 lb 4.8 oz (87.7 kg)  03/18/20 195 lb 3.2 oz (88.5 kg)  08/27/19 191 lb 8 oz (86.9 kg)     Health Maintenance Due  Topic Date Due   Hepatitis C Screening  Never done   Zoster Vaccines- Shingrix (1 of 2) Never done   PNA vac Low Risk Adult (1 of 2 - PCV13) Never done   COLONOSCOPY (Pts 45-41yr Insurance coverage will need to be confirmed)  03/21/2019   COVID-19 Vaccine (4 - Booster [place] 02/06/2020   INFLUENZA VACCINE  08/16/2020    There are no preventive care reminders to display for this patient.  Lab Results  Component Value Date   TSH 1.37 08/10/2020   Lab Results  Component Value Date   WBC 6.2 08/10/2020   HGB 15.8 08/10/2020   HCT 46.6 08/10/2020   MCV 98.3 08/10/2020   PLT 294 08/10/2020   Lab  Results  Component Value Date   NA 139 08/10/2020   K 4.6 08/10/2020   CO2 24 08/10/2020   GLUCOSE 95 08/10/2020   BUN 14 08/10/2020   CREATININE 1.21 08/10/2020   BILITOT 1.0 08/10/2020   ALKPHOS 58 02/13/2015   AST 22 08/10/2020   ALT 18 08/10/2020   PROT 6.6 08/10/2020   ALBUMIN 4.2 02/13/2015   CALCIUM 9.3 08/10/2020   ANIONGAP 9 02/13/2015   EGFR 62 08/10/2020   Lab Results  Component Value Date   CHOL 203 (H) 08/10/2020   Lab Results  Component Value Date   HDL 53 08/10/2020   Lab Results  Component Value Date   LDLCALC 123 (H) 08/10/2020   Lab Results  Component Value Date   TRIG 157 (H) 08/10/2020   Lab Results  Component Value Date   CHOLHDL 3.8 08/10/2020   No results found for: HGBA1C    Assessment & Plan:   Problem List Items Addressed This Visit   None   No orders of the defined types were placed in this encounter.   Follow-up: No follow-ups on file.    Cletis Athens, MD

## 2020-11-15 ENCOUNTER — Other Ambulatory Visit: Payer: Self-pay | Admitting: Internal Medicine

## 2021-02-11 ENCOUNTER — Other Ambulatory Visit: Payer: Self-pay | Admitting: Internal Medicine

## 2021-05-12 ENCOUNTER — Other Ambulatory Visit: Payer: Self-pay | Admitting: Internal Medicine

## 2021-05-31 ENCOUNTER — Encounter: Payer: Self-pay | Admitting: Internal Medicine

## 2021-05-31 ENCOUNTER — Ambulatory Visit (INDEPENDENT_AMBULATORY_CARE_PROVIDER_SITE_OTHER): Payer: Medicare Other | Admitting: Internal Medicine

## 2021-05-31 VITALS — BP 135/72 | HR 74 | Ht 70.0 in | Wt 194.5 lb

## 2021-05-31 DIAGNOSIS — M545 Low back pain, unspecified: Secondary | ICD-10-CM | POA: Diagnosis not present

## 2021-05-31 DIAGNOSIS — M150 Primary generalized (osteo)arthritis: Secondary | ICD-10-CM

## 2021-05-31 DIAGNOSIS — I10 Essential (primary) hypertension: Secondary | ICD-10-CM

## 2021-05-31 DIAGNOSIS — M159 Polyosteoarthritis, unspecified: Secondary | ICD-10-CM

## 2021-05-31 DIAGNOSIS — J029 Acute pharyngitis, unspecified: Secondary | ICD-10-CM | POA: Diagnosis not present

## 2021-05-31 DIAGNOSIS — G8929 Other chronic pain: Secondary | ICD-10-CM

## 2021-05-31 DIAGNOSIS — J069 Acute upper respiratory infection, unspecified: Secondary | ICD-10-CM | POA: Diagnosis not present

## 2021-05-31 LAB — POC COVID19 BINAXNOW: SARS Coronavirus 2 Ag: NEGATIVE

## 2021-05-31 MED ORDER — AMOXICILLIN-POT CLAVULANATE 875-125 MG PO TABS: 1.0000 | ORAL_TABLET | Freq: Two times a day (BID) | ORAL | 0 refills | Status: DC

## 2021-05-31 NOTE — Assessment & Plan Note (Signed)
No change stable at the present time ?

## 2021-05-31 NOTE — Assessment & Plan Note (Signed)

## 2021-05-31 NOTE — Assessment & Plan Note (Signed)
Patient was started on Augmentin 

## 2021-05-31 NOTE — Progress Notes (Signed)
? ?Established Patient Office Visit ? ?Subjective:  ?Patient ID: Tony Mendez, male    DOB: July 03, 1940  Age: 81 y.o. MRN: [id number] ? ?CC:  ?Chief Complaint  ?Patient presents with  ? Follow-up  ? Sore Throat  ?  Patient has had sore throat x 2 weeks or more. Patient thinks it is from sinus drainage.   ? ? ?URI  ?This is a new problem. There has been no fever. Associated symptoms include congestion and coughing. Pertinent negatives include no abdominal pain, chest pain, diarrhea, headaches or nausea. He has tried nothing for the symptoms.  ? ?[name] presents for chest cold ? ?Past Medical History:  ?Diagnosis Date  ? Arthritis   ? BPH (benign prostatic hyperplasia)   ? Cancer [facility])   ? Elevated PSA 02/02/2015  ? GERD (gastroesophageal reflux disease)   ? Gout   ? Motion sickness   ? Nocturia   ? Prostate cancer ([place])   ? Prostatitis   ? ? ?Past Surgical History:  ?Procedure Laterality Date  ? APPENDECTOMY    ? COLONOSCOPY WITH PROPOFOL N/A 03/20/2016  ? Procedure: COLONOSCOPY WITH PROPOFOL;  Surgeon: Lucilla Lame, MD;  Location: [place];  Service: Endoscopy;  Laterality: N/A;  ? CYSTOSCOPY  11/01/2015  ? Procedure: CYSTOSCOPY;  Surgeon: Hollice Espy, MD;  Location: [facility];  Service: Urology;;  ? POLYPECTOMY  03/20/2016  ? Procedure: POLYPECTOMY;  Surgeon: Lucilla Lame, MD;  Location: [place];  Service: Endoscopy;;  ? RADIOACTIVE SEED IMPLANT N/A 11/01/2015  ? Procedure: RADIOACTIVE SEED IMPLANT/BRACHYTHERAPY IMPLANT;  Surgeon: Hollice Espy, MD;  Location: [facility];  Service: Urology;  Laterality: N/A;  ? SKIN CANCER EXCISION    ? TONSILLECTOMY    ? ? ?Family History  ?Problem Relation Age of Onset  ? Heart disease Father   ? Kidney disease Father   ?     one kidney removed  ? Stroke Mother   ? Prostate cancer Neg Hx   ? ? ?Social History  ? ?Socioeconomic History  ? Marital status: Divorced  ?  Spouse name: Not on file  ? Number of children: Not on file  ? Years of education:  Not on file  ? Highest education level: Not on file  ?Occupational History  ? Not on file  ?Tobacco Use  ? Smoking status: Former  ?  Types: Cigarettes  ?  Quit date: 12/21/1971  ?  Years since quitting: 49.4  ? Smokeless tobacco: Never  ?Substance and Sexual Activity  ? Alcohol use: Yes  ?  Alcohol/week: 21.0 standard drinks  ?  Types: 7 Glasses of wine, 14 Cans of beer per week  ?  Comment: 23 oz a day  ? Drug use: No  ? Sexual activity: Not on file  ?Other Topics Concern  ? Not on file  ?Social History Narrative  ? Not on file  ? ?Social Determinants of Health  ? ?Financial Resource Strain: Not on file  ?Food Insecurity: Not on file  ?Transportation Needs: Not on file  ?Physical Activity: Not on file  ?Stress: Not on file  ?Social Connections: Not on file  ?Intimate Partner Violence: Not on file  ? ? ? ?Current Outpatient Medications:  ?  acetaminophen (TYLENOL) 650 MG CR tablet, One tab  as needed every 8 hours for pain, 90 tabs, Disp: , Rfl:  ?  amoxicillin-clavulanate (AUGMENTIN) 875-125 MG tablet, Take 1 tablet by mouth 2 (two) times daily., Disp: 20 tablet, Rfl: 0 ?  aspirin 81 MG tablet, Take 81 mg by mouth daily. Reported on 02/02/2015, Disp: , Rfl:  ?  CINNAMON PO, Take by mouth daily., Disp: , Rfl:  ?  docusate sodium (COLACE) 100 MG capsule, Take 100 mg by mouth 2 (two) times daily., Disp: , Rfl:  ?  GARLIC PO, Take by mouth., Disp: , Rfl:  ?  lisinopril (ZESTRIL) 20 MG tablet, TAKE 1 TABLET BY MOUTH DAILY, Disp: 90 tablet, Rfl: 3 ?  meloxicam (MOBIC) 7.5 MG tablet, TAKE 1 TABLET BY MOUTH DAILY, Disp: 30 tablet, Rfl: 3 ?  Multiple Vitamin (MULTIVITAMIN) tablet, Take 1 tablet by mouth daily., Disp: , Rfl:  ?  pantoprazole (PROTONIX) 40 MG tablet, TAKE 1 TABLET BY MOUTH DAILY, Disp: 90 tablet, Rfl: 3 ?  sildenafil (VIAGRA) 50 MG tablet, TK 1 T PO PRN, Disp: , Rfl:   ? ?Allergies  ?Allergen Reactions  ? Flomax [Tamsulosin Hcl] Other (See Comments)  ?  Dizziness  ? ? ?ROS ?Review of Systems  ?HENT:  Positive  for congestion.   ?Respiratory:  Positive for cough.   ?Cardiovascular:  Negative for chest pain.  ?Gastrointestinal:  Negative for abdominal pain, diarrhea and nausea.  ?Neurological:  Negative for headaches.  ?Hematological:  Negative for adenopathy.  ?Psychiatric/Behavioral:  Negative for behavioral problems.   ? ?  ?Objective:  ?  ?Physical Exam ?Vitals reviewed.  ?Constitutional:   ?   Appearance: Normal appearance.  ?HENT:  ?   Mouth/Throat:  ?   Mouth: Mucous membranes are moist.  ? ?   Comments: Sore throat ?Eyes:  ?   Pupils: Pupils are equal, round, and reactive to light.  ?Neck:  ?   Vascular: No carotid bruit.  ?Cardiovascular:  ?   Rate and Rhythm: Normal rate and regular rhythm.  ?   Pulses: Normal pulses.  ?   Heart sounds: Normal heart sounds.  ?Pulmonary:  ?   Effort: Pulmonary effort is normal.  ?   Breath sounds: Normal breath sounds.  ?Abdominal:  ?   General: Bowel sounds are normal.  ?   Palpations: Abdomen is soft. There is no hepatomegaly, splenomegaly or mass.  ?   Tenderness: There is no abdominal tenderness.  ?   Hernia: No hernia is present.  ?Musculoskeletal:  ?   Cervical back: Neck supple.  ?   Right lower leg: No edema.  ?   Left lower leg: No edema.  ?Skin: ?   Findings: No rash.  ?Neurological:  ?   Mental Status: He is alert and oriented to person, place, and time.  ?   Motor: No weakness.  ?Psychiatric:     ?   Mood and Affect: Mood normal.     ?   Behavior: Behavior normal.  ? ? ?BP 135/72   Pulse 74   Ht 5' 10"  (1.778 m)   Wt 194 lb 8 oz (88.2 kg)   BMI 27.91 kg/m?  ?Wt Readings from Last 3 Encounters:  ?05/31/21 194 lb 8 oz (88.2 kg)  ?08/16/20 193 lb 4.8 oz (87.7 kg)  ?03/18/20 195 lb 3.2 oz (88.5 kg)  ? ? ? ?Health Maintenance Due  ?Topic Date Due  ? Hepatitis C Screening  Never done  ? Zoster Vaccines- Shingrix (1 of 2) Never done  ? Pneumonia Vaccine 9+ Years old (1 - PCV) Never done  ? COLONOSCOPY (Pts 45-57yr Insurance coverage will need to be confirmed)  03/21/2019   ? COVID-19 Vaccine (4 - Booster for [place]) 01/01/2020  ? ? ?There are no preventive care reminders to display for this patient. ? ?Lab Results  ?Component Value Date  ? TSH 1.37 08/10/2020  ? ?Lab Results  ?Component Value Date  ? WBC 6.2 08/10/2020  ? HGB 15.8 08/10/2020  ? HCT 46.6 08/10/2020  ? MCV 98.3 08/10/2020  ? PLT 294 08/10/2020  ? ?Lab Results  ?Component Value Date  ? NA 139 08/10/2020  ? K 4.6 08/10/2020  ? CO2 24 08/10/2020  ? GLUCOSE 95 08/10/2020  ? BUN 14 08/10/2020  ? CREATININE 1.21 08/10/2020  ? BILITOT 1.0 08/10/2020  ? ALKPHOS 58 02/13/2015  ? AST 22 08/10/2020  ? ALT 18 08/10/2020  ? PROT 6.6 08/10/2020  ? ALBUMIN 4.2 02/13/2015  ? CALCIUM 9.3 08/10/2020  ? ANIONGAP 9 02/13/2015  ? EGFR 62 08/10/2020  ? ?Lab Results  ?Component Value Date  ? CHOL 203 (H) 08/10/2020  ? ?Lab Results  ?Component Value Date  ? HDL 53 08/10/2020  ? ?Lab Results  ?Component Value Date  ? [place] 123 (H) 08/10/2020  ? ?Lab Results  ?Component Value Date  ? TRIG 157 (H) 08/10/2020  ? ?Lab Results  ?Component Value Date  ? CHOLHDL 3.8 08/10/2020  ? ?No results found for: HGBA1C ? ?  ?Assessment & Plan:  ? ?Problem List Items Addressed This Visit   ? ?  ? Cardiovascular and Mediastinum  ? Primary hypertension  ?   Patient denies any chest pain or shortness of breath there is no history of palpitation or paroxysmal nocturnal dyspnea ?  patient was advised to follow low-salt low-cholesterol diet ? ?  ideally I want to keep systolic blood pressure below 130 mmHg, patient was asked to check blood pressure one times a week and give me a report on that.  Patient will be follow-up in 3 months  or earlier as needed, patient will call me back for any change in the cardiovascular symptoms ?Patient was advised to buy a book from local bookstore concerning blood pressure and read several chapters  every day.  This will be supplemented by some of the material we will give him from the office.  Patient should also utilize  other resources like YouTube and Internet to learn more about the blood pressure and the diet. ? ?  ?  ?  ? Respiratory  ? URI, acute  ?  Patient was started on Augmentin ? ?  ?  ?  ? Musculoskeletal and I

## 2021-05-31 NOTE — Assessment & Plan Note (Signed)
-   Patient's back pain is under control with medication.  - Encouraged the patient to stretch or do yoga as able to help with back pain 

## 2021-06-09 ENCOUNTER — Other Ambulatory Visit: Payer: Self-pay | Admitting: *Deleted

## 2021-06-09 DIAGNOSIS — J029 Acute pharyngitis, unspecified: Secondary | ICD-10-CM

## 2021-06-09 MED ORDER — AMOXICILLIN-POT CLAVULANATE 875-125 MG PO TABS: 1.0000 | ORAL_TABLET | Freq: Two times a day (BID) | ORAL | 0 refills | Status: DC

## 2021-06-10 DIAGNOSIS — R5383 Other fatigue: Secondary | ICD-10-CM | POA: Diagnosis not present

## 2021-06-10 DIAGNOSIS — J029 Acute pharyngitis, unspecified: Secondary | ICD-10-CM | POA: Diagnosis not present

## 2021-06-16 ENCOUNTER — Encounter: Payer: Self-pay | Admitting: Nurse Practitioner

## 2021-06-16 ENCOUNTER — Ambulatory Visit (INDEPENDENT_AMBULATORY_CARE_PROVIDER_SITE_OTHER): Payer: Medicare Other | Admitting: Nurse Practitioner

## 2021-06-16 VITALS — BP 128/75 | HR 51 | Ht 70.0 in | Wt 191.9 lb

## 2021-06-16 DIAGNOSIS — B370 Candidal stomatitis: Secondary | ICD-10-CM | POA: Diagnosis not present

## 2021-06-16 MED ORDER — NYSTATIN 100000 UNIT/ML MT SUSP: 5.0000 mL | Freq: Three times a day (TID) | OROMUCOSAL | 1 refills | Status: DC | PRN

## 2021-06-16 NOTE — Progress Notes (Signed)
Established Patient Office Visit  Subjective:  Patient ID: Tony Mendez, male    DOB: 04-25-1940  Age: 81 y.o. MRN: [id number]  CC:  Chief Complaint  Patient presents with   Sore Throat    Patient presents today with a sore throat, pt was given Augmentin and still has pain in the throat. Patient states he has white on his tongue and around the inside of his mouth.      HPI  Tony Mendez presents with white patch on the tongue and around the cheeks. He has some  white spot on the tongue and  inside of the checks. Patient said the spots were there even he stared Augmentin. He also complaints of mild sore throat.   HPI   Past Medical History:  Diagnosis Date   Arthritis    BPH (benign prostatic hyperplasia)    Cancer (HCC)    Elevated PSA 02/02/2015   GERD (gastroesophageal reflux disease)    Gout    Motion sickness    Nocturia    Prostate cancer ([place])    Prostatitis     Past Surgical History:  Procedure Laterality Date   APPENDECTOMY     COLONOSCOPY WITH PROPOFOL N/A 03/20/2016   Procedure: COLONOSCOPY WITH PROPOFOL;  Surgeon: Lucilla Lame, MD;  Location: [facility];  Service: Endoscopy;  Laterality: N/A;   CYSTOSCOPY  11/01/2015   Procedure: CYSTOSCOPY;  Surgeon: Hollice Espy, MD;  Location: [facility];  Service: Urology;;   POLYPECTOMY  03/20/2016   Procedure: POLYPECTOMY;  Surgeon: Lucilla Lame, MD;  Location: [facility];  Service: Endoscopy;;   RADIOACTIVE SEED IMPLANT N/A 11/01/2015   Procedure: RADIOACTIVE SEED IMPLANT/BRACHYTHERAPY IMPLANT;  Surgeon: Hollice Espy, MD;  Location: [facility];  Service: Urology;  Laterality: N/A;   SKIN CANCER EXCISION     TONSILLECTOMY      Family History  Problem Relation Age of Onset   Heart disease Father    Kidney disease Father        one kidney removed   Stroke Mother    Prostate cancer Neg Hx     Social History   Socioeconomic History   Marital status: Divorced    Spouse name: Not on file    Number of children: Not on file   Years of education: Not on file   Highest education level: Not on file  Occupational History   Not on file  Tobacco Use   Smoking status: Former    Types: Cigarettes    Quit date: 12/21/1971    Years since quitting: 49.5   Smokeless tobacco: Never  Substance and Sexual Activity   Alcohol use: Yes    Alcohol/week: 21.0 standard drinks    Types: 7 Glasses of wine, 14 Cans of beer per week    Comment: 23 oz a day   Drug use: No   Sexual activity: Not on file  Other Topics Concern   Not on file  Social History Narrative   Not on file   Social Determinants of Health   Financial Resource Strain: Not on file  Food Insecurity: Not on file  Transportation Needs: Not on file  Physical Activity: Not on file  Stress: Not on file  Social Connections: Not on file  Intimate Partner Violence: Not on file     Outpatient Medications Prior to Visit  Medication Sig Dispense Refill   acetaminophen (TYLENOL) 650 MG CR tablet One tab  as needed every 8 hours for pain, 90 tabs  aspirin 81 MG tablet Take 81 mg by mouth daily. Reported on 02/02/2015     CINNAMON PO Take by mouth daily.     docusate sodium (COLACE) 100 MG capsule Take 100 mg by mouth 2 (two) times daily.     GARLIC PO Take by mouth.     lisinopril (ZESTRIL) 20 MG tablet TAKE 1 TABLET BY MOUTH DAILY 90 tablet 3   meloxicam (MOBIC) 7.5 MG tablet TAKE 1 TABLET BY MOUTH DAILY 30 tablet 3   Multiple Vitamin (MULTIVITAMIN) tablet Take 1 tablet by mouth daily.     pantoprazole (PROTONIX) 40 MG tablet TAKE 1 TABLET BY MOUTH DAILY 90 tablet 3   sildenafil (VIAGRA) 50 MG tablet TK 1 T PO PRN     amoxicillin-clavulanate (AUGMENTIN) 875-125 MG tablet Take 1 tablet by mouth 2 (two) times daily. (Patient not taking: Reported on 06/16/2021) 20 tablet 0   No facility-administered medications prior to visit.    Allergies  Allergen Reactions   Flomax [Tamsulosin Hcl] Other (See Comments)    Dizziness     ROS Review of Systems  Constitutional:  Negative for activity change, appetite change and fatigue.  HENT:  Positive for sore throat. Negative for congestion and facial swelling.        White patches on tongue and cheeks  Eyes:  Negative for pain, discharge and visual disturbance.  Respiratory:  Negative for apnea, cough and wheezing.   Cardiovascular:  Negative for chest pain and palpitations.  Gastrointestinal:  Negative for abdominal distention, blood in stool and vomiting.  Genitourinary: Negative.   Musculoskeletal:  Negative for arthralgias and joint swelling.  Skin: Negative.   Neurological: Negative.   Psychiatric/Behavioral:  Negative for agitation, behavioral problems, confusion and decreased concentration.      Objective:    Physical Exam Constitutional:      Appearance: He is well-developed. He is obese.  HENT:     Head: Normocephalic and atraumatic.     Right Ear: Tympanic membrane normal.     Left Ear: Tympanic membrane normal.     Nose: No congestion or rhinorrhea.     Mouth/Throat:     Mouth: Mucous membranes are moist.     Tongue: No lesions (white patch).     Pharynx: Uvula midline. No oropharyngeal exudate.     Tonsils: No tonsillar exudate.  Cardiovascular:     Rate and Rhythm: Normal rate and regular rhythm.     Pulses: Normal pulses.     Heart sounds: Normal heart sounds.  Pulmonary:     Effort: Pulmonary effort is normal.     Breath sounds: Normal breath sounds.  Abdominal:     Palpations: Abdomen is soft.  Musculoskeletal:        General: Normal range of motion.     Cervical back: Normal range of motion.  Skin:    General: Skin is warm.     Capillary Refill: Capillary refill takes less than 2 seconds.  Neurological:     General: No focal deficit present.     Mental Status: He is alert and oriented to person, place, and time. Mental status is at baseline.  Psychiatric:        Mood and Affect: Mood normal.        Behavior: Behavior normal.         Thought Content: Thought content normal.        Judgment: Judgment normal.    BP 128/75   Pulse (!) 51   Ht  5' 10"  (1.778 m)   Wt 191 lb 14.4 oz (87 kg)   BMI 27.53 kg/m  Wt Readings from Last 3 Encounters:  06/16/21 191 lb 14.4 oz (87 kg)  05/31/21 194 lb 8 oz (88.2 kg)  08/16/20 193 lb 4.8 oz (87.7 kg)     Health Maintenance Due  Topic Date Due   Hepatitis C Screening  Never done   Zoster Vaccines- Shingrix (1 of 2) Never done   Pneumonia Vaccine 13+ Years old (1 - PCV) Never done   COLONOSCOPY (Pts 45-30yr Insurance coverage will need to be confirmed)  03/21/2019   COVID-19 Vaccine (4 - Booster for [place]) 01/01/2020    There are no preventive care reminders to display for this patient.  Lab Results  Component Value Date   TSH 1.37 08/10/2020   Lab Results  Component Value Date   WBC 6.2 08/10/2020   HGB 15.8 08/10/2020   HCT 46.6 08/10/2020   MCV 98.3 08/10/2020   PLT 294 08/10/2020   Lab Results  Component Value Date   NA 139 08/10/2020   K 4.6 08/10/2020   CO2 24 08/10/2020   GLUCOSE 95 08/10/2020   BUN 14 08/10/2020   CREATININE 1.21 08/10/2020   BILITOT 1.0 08/10/2020   ALKPHOS 58 02/13/2015   AST 22 08/10/2020   ALT 18 08/10/2020   PROT 6.6 08/10/2020   ALBUMIN 4.2 02/13/2015   CALCIUM 9.3 08/10/2020   ANIONGAP 9 02/13/2015   EGFR 62 08/10/2020   Lab Results  Component Value Date   CHOL 203 (H) 08/10/2020   Lab Results  Component Value Date   HDL 53 08/10/2020   Lab Results  Component Value Date   LDLCALC 123 (H) 08/10/2020   Lab Results  Component Value Date   TRIG 157 (H) 08/10/2020   Lab Results  Component Value Date   CHOLHDL 3.8 08/10/2020   No results found for: HGBA1C    Assessment & Plan:   Problem List Items Addressed This Visit       Digestive   Thrush - Primary    Started him on magic mouth wash swish ans spit 3 times a day. Will continue to monitor.       Relevant Medications   magic  mouthwash (nystatin, lidocaine, diphenhydrAMINE, alum & mag hydroxide) suspension     Meds ordered this encounter  Medications   magic mouthwash (nystatin, lidocaine, diphenhydrAMINE, alum & mag hydroxide) suspension    Sig: Swish and spit 5 mLs 3 (three) times daily as needed for mouth pain.    Dispense:  180 mL    Refill:  1     Follow-up: Return if symptoms worsen or fail to improve.    CTheresia Lo NP

## 2021-06-21 DIAGNOSIS — B370 Candidal stomatitis: Secondary | ICD-10-CM | POA: Insufficient documentation

## 2021-06-21 NOTE — Assessment & Plan Note (Signed)
Started him on magic mouth wash swish ans spit 3 times a day. Will continue to monitor.

## 2021-07-01 ENCOUNTER — Encounter: Payer: Self-pay | Admitting: Nurse Practitioner

## 2021-07-01 ENCOUNTER — Ambulatory Visit (INDEPENDENT_AMBULATORY_CARE_PROVIDER_SITE_OTHER): Payer: Medicare Other | Admitting: Nurse Practitioner

## 2021-07-01 VITALS — BP 121/70 | HR 65 | Ht 70.0 in | Wt 191.0 lb

## 2021-07-01 DIAGNOSIS — J029 Acute pharyngitis, unspecified: Secondary | ICD-10-CM

## 2021-07-01 DIAGNOSIS — B370 Candidal stomatitis: Secondary | ICD-10-CM

## 2021-07-01 NOTE — Progress Notes (Signed)
Established Patient Office Visit  Subjective:  Patient ID: Tony Mendez, male    DOB: Jul 01, 1940  Age: 81 y.o. MRN: [id number]  CC:  Chief Complaint  Patient presents with   Sore Throat     HPI  Tony Mendez presents with complaint of sore throat off and on depending on the thing he eats. He also complaints of burning sensation in the throat when he eats or drink  acidic stuff.  Patient states that he has white patch on the tongue and back of the throat that he can visualize in the mirror using a flashlight. Patient had history of GERD, hypertension and arthritis.   Past Medical History:  Diagnosis Date   Arthritis    BPH (benign prostatic hyperplasia)    Cancer (HCC)    Elevated PSA 02/02/2015   GERD (gastroesophageal reflux disease)    Gout    Motion sickness    Nocturia    Prostate cancer (HCC)    Prostatitis     Past Surgical History:  Procedure Laterality Date   APPENDECTOMY     COLONOSCOPY WITH PROPOFOL N/A 03/20/2016   Procedure: COLONOSCOPY WITH PROPOFOL;  Surgeon: Midge Minium, MD;  Location: [facility];  Service: Endoscopy;  Laterality: N/A;   CYSTOSCOPY  11/01/2015   Procedure: CYSTOSCOPY;  Surgeon: Vanna Scotland, MD;  Location: [facility];  Service: Urology;;   POLYPECTOMY  03/20/2016   Procedure: POLYPECTOMY;  Surgeon: Midge Minium, MD;  Location: [facility];  Service: Endoscopy;;   RADIOACTIVE SEED IMPLANT N/A 11/01/2015   Procedure: RADIOACTIVE SEED IMPLANT/BRACHYTHERAPY IMPLANT;  Surgeon: Vanna Scotland, MD;  Location: [facility];  Service: Urology;  Laterality: N/A;   SKIN CANCER EXCISION     TONSILLECTOMY      Family History  Problem Relation Age of Onset   Heart disease Father    Kidney disease Father        one kidney removed   Stroke Mother    Prostate cancer Neg Hx     Social History   Socioeconomic History   Marital status: Divorced    Spouse name: Not on file   Number of children: Not on file   Years of  education: Not on file   Highest education level: Not on file  Occupational History   Not on file  Tobacco Use   Smoking status: Former    Types: Cigarettes    Quit date: 12/21/1971    Years since quitting: 49.5   Smokeless tobacco: Never  Substance and Sexual Activity   Alcohol use: Yes    Alcohol/week: 21.0 standard drinks of alcohol    Types: 7 Glasses of wine, 14 Cans of beer per week    Comment: 23 oz a day   Drug use: No   Sexual activity: Not on file  Other Topics Concern   Not on file  Social History Narrative   Not on file   Social Determinants of Health   Financial Resource Strain: Not on file  Food Insecurity: Not on file  Transportation Needs: Not on file  Physical Activity: Not on file  Stress: Not on file  Social Connections: Not on file  Intimate Partner Violence: Not on file     Outpatient Medications Prior to Visit  Medication Sig Dispense Refill   acetaminophen (TYLENOL) 650 MG CR tablet One tab  as needed every 8 hours for pain, 90 tabs     aspirin 81 MG tablet Take 81 mg by mouth daily. Reported  on 02/02/2015     CINNAMON PO Take by mouth daily.     docusate sodium (COLACE) 100 MG capsule Take 100 mg by mouth 2 (two) times daily.     GARLIC PO Take by mouth.     lisinopril (ZESTRIL) 20 MG tablet TAKE 1 TABLET BY MOUTH DAILY 90 tablet 3   meloxicam (MOBIC) 7.5 MG tablet TAKE 1 TABLET BY MOUTH DAILY 30 tablet 3   Multiple Vitamin (MULTIVITAMIN) tablet Take 1 tablet by mouth daily.     pantoprazole (PROTONIX) 40 MG tablet TAKE 1 TABLET BY MOUTH DAILY 90 tablet 3   sildenafil (VIAGRA) 50 MG tablet TK 1 T PO PRN     magic mouthwash (nystatin, lidocaine, diphenhydrAMINE, alum & mag hydroxide) suspension Swish and spit 5 mLs 3 (three) times daily as needed for mouth pain. (Patient not taking: Reported on 07/01/2021) 180 mL 1   amoxicillin-clavulanate (AUGMENTIN) 875-125 MG tablet Take 1 tablet by mouth 2 (two) times daily. (Patient not taking: Reported on  06/16/2021) 20 tablet 0   No facility-administered medications prior to visit.    Allergies  Allergen Reactions   Flomax [Tamsulosin Hcl] Other (See Comments)    Dizziness    ROS Review of Systems  Constitutional:  Negative for activity change, appetite change and fatigue.  HENT:  Positive for sore throat. Negative for facial swelling.   Eyes:  Negative for pain, discharge and visual disturbance.  Respiratory:  Negative for apnea and wheezing.   Cardiovascular:  Negative for chest pain and palpitations.  Gastrointestinal:  Negative for abdominal distention and blood in stool.  Genitourinary: Negative.   Musculoskeletal:  Negative for arthralgias and joint swelling.  Skin: Negative.   Neurological: Negative.   Psychiatric/Behavioral:  Negative for agitation, behavioral problems, confusion and decreased concentration.       Objective:    Physical Exam Constitutional:      Appearance: He is well-developed and overweight.  HENT:     Head: Normocephalic and atraumatic.     Right Ear: Tympanic membrane normal.     Left Ear: Tympanic membrane normal.     Nose: No congestion or rhinorrhea.     Mouth/Throat:     Mouth: Mucous membranes are moist.     Pharynx: Uvula midline.     Tonsils: No tonsillar exudate.  Cardiovascular:     Rate and Rhythm: Normal rate and regular rhythm.     Pulses: Normal pulses.     Heart sounds: Normal heart sounds.  Pulmonary:     Effort: Pulmonary effort is normal.     Breath sounds: Normal breath sounds.  Abdominal:     Palpations: Abdomen is soft.  Musculoskeletal:        General: Normal range of motion.     Cervical back: Normal range of motion.  Skin:    General: Skin is warm.     Capillary Refill: Capillary refill takes less than 2 seconds.  Neurological:     General: No focal deficit present.     Mental Status: He is alert and oriented to person, place, and time. Mental status is at baseline.  Psychiatric:        Mood and Affect: Mood  normal.        Behavior: Behavior normal.        Thought Content: Thought content normal.        Judgment: Judgment normal.     BP 121/70   Pulse 65   Ht 5\' 10"  (1.778 m)  Wt 191 lb (86.6 kg)   BMI 27.41 kg/m  Wt Readings from Last 3 Encounters:  07/01/21 191 lb (86.6 kg)  06/16/21 191 lb 14.4 oz (87 kg)  05/31/21 194 lb 8 oz (88.2 kg)     Health Maintenance Due  Topic Date Due   Hepatitis C Screening  Never done   Zoster Vaccines- Shingrix (1 of 2) Never done   Pneumonia Vaccine 74+ Years old (1 - PCV) Never done   COLONOSCOPY (Pts 45-12yrs Insurance coverage will need to be confirmed)  03/21/2019   COVID-19 Vaccine (4 - Booster for [employer] series) 01/01/2020    There are no preventive care reminders to display for this patient.  Lab Results  Component Value Date   TSH 1.37 08/10/2020   Lab Results  Component Value Date   WBC 6.2 08/10/2020   HGB 15.8 08/10/2020   HCT 46.6 08/10/2020   MCV 98.3 08/10/2020   PLT 294 08/10/2020   Lab Results  Component Value Date   NA 139 08/10/2020   K 4.6 08/10/2020   CO2 24 08/10/2020   GLUCOSE 95 08/10/2020   BUN 14 08/10/2020   CREATININE 1.21 08/10/2020   BILITOT 1.0 08/10/2020   ALKPHOS 58 02/13/2015   AST 22 08/10/2020   ALT 18 08/10/2020   PROT 6.6 08/10/2020   ALBUMIN 4.2 02/13/2015   CALCIUM 9.3 08/10/2020   ANIONGAP 9 02/13/2015   EGFR 62 08/10/2020   Lab Results  Component Value Date   CHOL 203 (H) 08/10/2020   Lab Results  Component Value Date   HDL 53 08/10/2020   Lab Results  Component Value Date   LDLCALC 123 (H) 08/10/2020   Lab Results  Component Value Date   TRIG 157 (H) 08/10/2020   Lab Results  Component Value Date   CHOLHDL 3.8 08/10/2020   No results found for: "HGBA1C"    Assessment & Plan:   Problem List Items Addressed This Visit       Other   Sore throat - Primary    No thrush noted in the tongue or the back of cheeks. Advised patient to avoid acidic food. Patient  refused to take omeprazole. Referred to  GI for further evaluation      Relevant Orders   Ambulatory referral to ENT    No orders of the defined types were placed in this encounter.    Follow-up: No follow-ups on file.    Kara Dies, NP

## 2021-07-03 NOTE — Assessment & Plan Note (Signed)
No thrush noted in the tongue or the back of cheeks. Advised patient to avoid acidic food. Patient refused to take omeprazole. Referred to  GI for further evaluation

## 2021-07-06 ENCOUNTER — Other Ambulatory Visit: Payer: Self-pay

## 2021-07-06 DIAGNOSIS — C61 Malignant neoplasm of prostate: Secondary | ICD-10-CM

## 2021-07-07 ENCOUNTER — Other Ambulatory Visit: Payer: Medicare Other

## 2021-07-07 DIAGNOSIS — C61 Malignant neoplasm of prostate: Secondary | ICD-10-CM

## 2021-07-08 LAB — PSA: Prostate Specific Ag, Serum: 0.1 ng/mL (ref 0.0–4.0)

## 2021-07-13 ENCOUNTER — Ambulatory Visit: Payer: Medicare Other | Admitting: Urology

## 2021-07-13 ENCOUNTER — Encounter: Payer: Self-pay | Admitting: Urology

## 2021-07-13 VITALS — BP 118/68 | HR 77 | Ht 70.0 in | Wt 191.0 lb

## 2021-07-13 DIAGNOSIS — N401 Enlarged prostate with lower urinary tract symptoms: Secondary | ICD-10-CM | POA: Diagnosis not present

## 2021-07-13 DIAGNOSIS — N529 Male erectile dysfunction, unspecified: Secondary | ICD-10-CM | POA: Diagnosis not present

## 2021-07-13 DIAGNOSIS — R399 Unspecified symptoms and signs involving the genitourinary system: Secondary | ICD-10-CM

## 2021-07-13 DIAGNOSIS — Z8546 Personal history of malignant neoplasm of prostate: Secondary | ICD-10-CM

## 2021-07-13 DIAGNOSIS — R350 Frequency of micturition: Secondary | ICD-10-CM

## 2021-07-13 DIAGNOSIS — R351 Nocturia: Secondary | ICD-10-CM

## 2021-07-13 DIAGNOSIS — C61 Malignant neoplasm of prostate: Secondary | ICD-10-CM

## 2021-07-13 NOTE — Progress Notes (Signed)
07/13/21 10:35 AM   Tony Mendez Sep 14, 1940 [id number]  Referring provider:  Cletis Athens, MD [address] [place],  [place] [address]  Chief Complaint  Patient presents with   Prostate Cancer    Urological history  Prostate cancer  - Prostate cancer s/p brachytherapy on 11/01/15. -  Initially dx with Gleason 3+3 prostate cancer previously on active surveillance who underwent prostate MRI which showed a concerning area at the right base for high grade carcinoma.  - PSA 0.1 on 07/07/2021  2. BPH with LUTS  - IPSS 23/3  3.ED  - Contributing factors are  age, pelvic radiation, BPH and prostate cancer -Tried sildenafil  - SHIM 16  HPI: Tony Mendez is a 81 y.o.male who presents today for a follow-up with PSA.   He reports that he has urinary frequency and engages in toilet mapping. He intermittently has spontaneous erections. He is celibate and is not worried or bothered by his erections.   Patient denies any modifying or aggravating factors.  Patient denies any gross hematuria, dysuria or suprapubic/flank pain. Patient denies any fevers, chills, nausea or vomiting.    IPSS     Row Name 07/13/21 1000         International Prostate Symptom Score   How often have you had the sensation of not emptying your bladder? About half the time     How often have you had to urinate less than every two hours? Almost always     How often have you found you stopped and started again several times when you urinated? About half the time     How often have you found it difficult to postpone urination? About half the time     How often have you had a weak urinary stream? More than half the time     How often have you had to strain to start urination? Less than half the time     How many times did you typically get up at night to urinate? 3 Times     Total IPSS Score 23       Quality of Life due to urinary symptoms   If you were to spend the rest of your life with your urinary  condition just the way it is now how would you feel about that? Mixed              Score:  1-7 Mild 8-19 Moderate 20-35 Severe   SHIM     Row Name 07/13/21 1008         SHIM: Over the last 6 months:   How do you rate your confidence that you could get and keep an erection? Moderate     When you had erections with sexual stimulation, how often were your erections hard enough for penetration (entering your partner)? Most Times (much more than half the time)     During sexual intercourse, how often were you able to maintain your erection after you had penetrated (entered) your partner? Sometimes (about half the time)     During sexual intercourse, how difficult was it to maintain your erection to completion of intercourse? Difficult     When you attempted sexual intercourse, how often was it satisfactory for you? Sometimes (about half the time)       SHIM Total Score   SHIM 16              PMH: Past Medical History:  Diagnosis Date   Arthritis  BPH (benign prostatic hyperplasia)    Cancer (HCC)    Elevated PSA 02/02/2015   GERD (gastroesophageal reflux disease)    Gout    Motion sickness    Nocturia    Prostate cancer [facility])    Prostatitis     Surgical History: Past Surgical History:  Procedure Laterality Date   APPENDECTOMY     COLONOSCOPY WITH PROPOFOL N/A 03/20/2016   Procedure: COLONOSCOPY WITH PROPOFOL;  Surgeon: Tony Lame, MD;  Location: [place];  Service: Endoscopy;  Laterality: N/A;   CYSTOSCOPY  11/01/2015   Procedure: CYSTOSCOPY;  Surgeon: Tony Espy, MD;  Location: [facility];  Service: Urology;;   POLYPECTOMY  03/20/2016   Procedure: POLYPECTOMY;  Surgeon: Tony Lame, MD;  Location: [place];  Service: Endoscopy;;   RADIOACTIVE SEED IMPLANT N/A 11/01/2015   Procedure: RADIOACTIVE SEED IMPLANT/BRACHYTHERAPY IMPLANT;  Surgeon: Tony Espy, MD;  Location: [facility];  Service: Urology;  Laterality: N/A;   SKIN CANCER  EXCISION     TONSILLECTOMY      Home Medications:  Allergies as of 07/13/2021       Reactions   Flomax [tamsulosin Hcl] Other (See Comments)   Dizziness        Medication List        Accurate as of July 13, 2021 10:35 AM. If you have any questions, ask your nurse or doctor.          acetaminophen 650 MG CR tablet Commonly known as: TYLENOL One tab  as needed every 8 hours for pain, 90 tabs   aspirin 81 MG tablet Take 81 mg by mouth daily. Reported on 02/02/2015   CINNAMON PO Take by mouth daily.   docusate sodium 100 MG capsule Commonly known as: COLACE Take 100 mg by mouth 2 (two) times daily.   GARLIC PO Take by mouth.   lisinopril 20 MG tablet Commonly known as: ZESTRIL TAKE 1 TABLET BY MOUTH DAILY   magic mouthwash (nystatin, lidocaine, diphenhydrAMINE, alum & mag hydroxide) suspension Swish and spit 5 mLs 3 (three) times daily as needed for mouth pain.   meloxicam 7.5 MG tablet Commonly known as: MOBIC TAKE 1 TABLET BY MOUTH DAILY   multivitamin tablet Take 1 tablet by mouth daily.   pantoprazole 40 MG tablet Commonly known as: PROTONIX TAKE 1 TABLET BY MOUTH DAILY   sildenafil 50 MG tablet Commonly known as: VIAGRA TK 1 T PO PRN        Allergies:  Allergies  Allergen Reactions   Flomax [Tamsulosin Hcl] Other (See Comments)    Dizziness    Family History: Family History  Problem Relation Age of Onset   Heart disease Father    Kidney disease Father        one kidney removed   Stroke Mother    Prostate cancer Neg Hx     Social History:  reports that he quit smoking about 49 years ago. His smoking use included cigarettes. He has never used smokeless tobacco. He reports current alcohol use of about 21.0 standard drinks of alcohol per week. He reports that he does not use drugs.   Physical Exam: BP 118/68   Pulse 77   Ht '5\' 10"'$  (1.778 m)   Wt 191 lb (86.6 kg)   BMI 27.41 kg/m   Constitutional:  Alert and oriented, No acute  distress. HEENT: [place]  AT, moist mucus membranes.  Trachea midline Cardiovascular: No clubbing, cyanosis, or edema. Respiratory: Normal respiratory effort, no increased work of breathing.  Neurologic: Grossly intact, no focal deficits, moving all 4 extremities. Psychiatric: Normal mood and affect.  Laboratory Data: Component     Latest Ref Rng 07/07/2021  Prostate Specific Ag, Serum     0.0 - 4.0 ng/mL 0.1   I have reviewed the labs.     Assessment & Plan:    1. Prostate cancer [facility]) - S/p brachytherapy on 10/2015 for prostate cancer progression with possible high grade lesion on MRI  - PSA with appropriate response - He has some urinary frequency/ urgency with minimal bother   2. BPH with LUTS - IPSS score is 23/3 - Continue conservative management, avoiding bladder irritants and timed voiding's - Most bothersome symptoms is/are frequency and nocturia - RTC in 12 months for IPSS, PSA and exam   3. Erectile dysfunction - SHIM score 16 - He is celibate and not bothered by this  - RTC in 12 months for repeat SHIM score and exam   Return in about 1 year (around 07/14/2022) for IPSS, PSA and exam.  [facility] [address], [place], [place] [address] [phone]  I, Kirke Shaggy Littlejohn,acting as a scribe for [facility], PA-C.,have documented all relevant documentation on the behalf of Gram Siedlecki, PA-C,as directed by  [facility], PA-C while in the presence of Lydia Toren, PA-C.  I have reviewed the above documentation for accuracy and completeness, and I agree with the above.    Zara Council, PA-C

## 2021-07-14 ENCOUNTER — Other Ambulatory Visit: Payer: Self-pay | Admitting: Internal Medicine

## 2021-07-21 ENCOUNTER — Encounter: Payer: Self-pay | Admitting: Emergency Medicine

## 2021-07-21 ENCOUNTER — Emergency Department
Admission: EM | Admit: 2021-07-21 | Discharge: 2021-07-21 | Disposition: A | Discharge status: Home / Self Care | Payer: Medicare Other | Attending: Emergency Medicine | Admitting: Emergency Medicine

## 2021-07-21 ENCOUNTER — Emergency Department: Payer: Medicare Other

## 2021-07-21 ENCOUNTER — Other Ambulatory Visit: Payer: Self-pay

## 2021-07-21 DIAGNOSIS — J029 Acute pharyngitis, unspecified: Secondary | ICD-10-CM | POA: Diagnosis not present

## 2021-07-21 DIAGNOSIS — L729 Follicular cyst of the skin and subcutaneous tissue, unspecified: Secondary | ICD-10-CM | POA: Diagnosis not present

## 2021-07-21 DIAGNOSIS — I10 Essential (primary) hypertension: Secondary | ICD-10-CM | POA: Diagnosis not present

## 2021-07-21 DIAGNOSIS — Z85038 Personal history of other malignant neoplasm of large intestine: Secondary | ICD-10-CM | POA: Diagnosis not present

## 2021-07-21 DIAGNOSIS — J3489 Other specified disorders of nose and nasal sinuses: Secondary | ICD-10-CM | POA: Diagnosis not present

## 2021-07-21 LAB — BASIC METABOLIC PANEL
Anion gap: 4 — ABNORMAL LOW (ref 5–15)
BUN: 15 mg/dL (ref 8–23)
CO2: 29 mmol/L (ref 22–32)
Calcium: 9.5 mg/dL (ref 8.9–10.3)
Chloride: 106 mmol/L (ref 98–111)
Creatinine, Ser: 1.14 mg/dL (ref 0.61–1.24)
GFR, Estimated: >60 mL/min (ref >60)
Glucose, Bld: 101 mg/dL — ABNORMAL HIGH (ref 70–99)
Potassium: 4.7 mmol/L (ref 3.5–5.1)
Sodium: 139 mmol/L (ref 135–145)

## 2021-07-21 LAB — CBC WITH DIFFERENTIAL/PLATELET
Abs Immature Granulocytes: 0.01 10*3/uL (ref 0.00–0.07)
Basophils Absolute: 0.1 10*3/uL (ref 0.0–0.1)
Basophils Relative: 1 %
Eosinophils Absolute: 0.2 10*3/uL (ref 0.0–0.5)
Eosinophils Relative: 4 %
HCT: 45.4 % (ref 39.0–52.0)
Hemoglobin: 15.3 g/dL (ref 13.0–17.0)
Immature Granulocytes: 0 %
Lymphocytes Relative: 22 %
Lymphs Abs: 1.3 10*3/uL (ref 0.7–4.0)
MCH: 32.5 pg (ref 26.0–34.0)
MCHC: 33.7 g/dL (ref 30.0–36.0)
MCV: 96.4 fL (ref 80.0–100.0)
Monocytes Absolute: 0.5 10*3/uL (ref 0.1–1.0)
Monocytes Relative: 8 %
Neutro Abs: 3.9 10*3/uL (ref 1.7–7.7)
Neutrophils Relative %: 65 %
Platelets: 262 10*3/uL (ref 150–400)
RBC: 4.71 MIL/uL (ref 4.22–5.81)
RDW: 12 % (ref 11.5–15.5)
WBC: 6 10*3/uL (ref 4.0–10.5)
nRBC: 0 % (ref 0.0–0.2)

## 2021-07-21 LAB — GROUP A STREP BY PCR: Group A Strep by PCR: NOT DETECTED

## 2021-07-21 LAB — RAPID HIV SCREEN (HIV 1/2 AB+AG)
HIV 1/2 Antibodies: NONREACTIVE
HIV-1 P24 Antigen - HIV24: NONREACTIVE

## 2021-07-21 MED ORDER — IOHEXOL 350 MG/ML SOLN
75.0000 mL | Freq: Once | INTRAVENOUS | Status: AC | PRN
  Administered 2021-07-21: 75 mL via INTRAVENOUS

## 2021-07-21 NOTE — ED Notes (Signed)
See triage note  Presents with a "scratchy" throat for about 2 months  Has been seen and placed on amoxil for 10 days  No change  then was dx'd with thrush  Cont's to have same sx's No fever

## 2021-07-21 NOTE — ED Triage Notes (Signed)
Pt in with co sore throat for 2 months states has been dx with thrush and strep throat by pmd. States symptoms not resolved.

## 2021-07-21 NOTE — ED Provider Notes (Signed)
[facility] Provider Note    Event Date/Time   First MD Initiated Contact with Patient 07/21/21 [phone]     (approximate)   History   Sore Throat   HPI  Tony Mendez is a 81 y.o. male who presents today for evaluation of sore throat for the past 2 months.  Patient reports that he was diagnosed with strep throat 2 months ago and was given a 10-day course of Augmentin.  He reports that he has continued to have a scratchy throat.  He was taking Magic mouthwash and nystatin without significant improvement of his symptoms.  He reports that he has had a hoarse voice intermittently for the past 2 months as well.  He has an appointment scheduled with ENT in a couple of weeks.  Denies tobacco use.  Patient Active Problem List   Diagnosis Date Noted   Thrush 06/21/2021   Sore throat 05/31/2021   Primary hypertension 08/16/2020   Sinus congestion 03/18/2020   URI, acute 08/27/2019   Encounter for general health examination 08/27/2019   Neck pain 06/10/2019   Primary osteoarthritis involving multiple joints 06/10/2019   Chronic midline low back pain without sciatica 02/10/2019   Hx of colonic polyps    Benign neoplasm of transverse colon    Rectal polyp    Elevated PSA 02/02/2015          Physical Exam   Triage Vital Signs: ED Triage Vitals  Enc Vitals Group     BP 07/21/21 0613 (!) 160/82     Pulse Rate 07/21/21 0613 68     Resp 07/21/21 0613 18     Temp 07/21/21 0613 97.7 F (36.5 C)     Temp Source 07/21/21 0613 Oral     SpO2 07/21/21 0613 96 %     Weight 07/21/21 0614 190 lb (86.2 kg)     Height 07/21/21 0614 '5\' 10"'$  (1.778 m)     Head Circumference --      Peak Flow --      Pain Score 07/21/21 0614 5     Pain Loc --      Pain Edu? --      Excl. in [place]? --     Most recent vital signs: Vitals:   07/21/21 0613 07/21/21 0725  BP: (!) 160/82 (!) 158/80  Pulse: 68 70  Resp: 18 16  Temp: 97.7 F (36.5 C)   SpO2: 96% 97%    Physical  Exam Vitals and nursing note reviewed.  Constitutional:      General: Awake and alert. No acute distress.    Appearance: Normal appearance. The patient is normal weight.  HENT:     Head: Normocephalic and atraumatic.     Mouth: Mucous membranes are moist.  Uvula midline.  No tonsillar exudate.  No soft palate fluctuance.  No trismus.  No voice change.  No sublingual swelling.  No tender cervical lymphadenopathy.  No nuchal rigidity Mild white coating to the posterior tongue, but not including the buccal mucosa or roof of mouth. Eyes:     General: PERRL. Normal EOMs        Right eye: No discharge.        Left eye: No discharge.     Conjunctiva/sclera: Conjunctivae normal.  Cardiovascular:     Rate and Rhythm: Normal rate and regular rhythm.     Pulses: Normal pulses.     Heart sounds: Normal heart sounds Pulmonary:     Effort: Pulmonary effort  is normal. No respiratory distress.     Breath sounds: Normal breath sounds.  Abdominal:     Abdomen is soft. There is no abdominal tenderness. No rebound or guarding. No distention. Musculoskeletal:        General: No swelling. Normal range of motion.     Cervical back: Normal range of motion and neck supple.  Skin:    General: Skin is warm and dry.     Capillary Refill: Capillary refill takes less than 2 seconds.     Findings: No rash.  Neurological:     Mental Status: The patient is awake and alert.      ED Results / Procedures / Treatments   Labs (all labs ordered are listed, but only abnormal results are displayed) Labs Reviewed  BASIC METABOLIC PANEL - Abnormal; Notable for the following components:      Result Value   Glucose, Bld 101 (*)    Anion gap 4 (*)    All other components within normal limits  GROUP A STREP BY PCR  CBC WITH DIFFERENTIAL/PLATELET  RAPID HIV SCREEN (HIV 1/2 AB+AG)     EKG     RADIOLOGY I independently reviewed and interpreted imaging and agree with radiologists findings.      PROCEDURES:  Critical Care performed:   Procedures   MEDICATIONS ORDERED IN ED: Medications  iohexol (OMNIPAQUE) 350 MG/ML injection 75 mL (75 mLs Intravenous Contrast Given 07/21/21 0852)     IMPRESSION / MDM / ASSESSMENT AND PLAN / ED COURSE  I reviewed the triage vital signs and the nursing notes.   Differential diagnosis includes, but is not limited to, viral pharyngitis, candidal pharyngitis, vocal cord lesion or paralysis, laryngopharyngeal reflux.  No hot potato voice, trismus, fever, neck pain or stiffness, uvular deviation to suggest peritonsillar or retropharyngeal abscess. Labs obtained are overall reassuring.  HIV is negative.  Patient is not immunocompromise and denies taking any oral or inhaled steroids.  He is not diabetic.  CT scan of his neck does not reveal any ominous lesions or vocal cord paralysis.  There was a finding of a nonspecific 2.1 x 1.3 cm cystic-appearing lesion in the right cheek, however there is no palpable or visible mass in his cheek on exam.  We discussed given his history of thrush empiric treatment with fluconazole, however I did recommend close outpatient follow-up with ENT for direct visualization via laryngoscopy.  Patient would like to wait for treatment until after this.  No airway edema or trouble breathing or swallowing.  No focal neurological findings.  Patient is overall awake, alert, nontoxic in appearance, without constitutional symptoms. We did discuss return precautions and the importance of close outpatient follow-up.  Patient understands and agrees with plan.  He was discharged in stable condition.   Patient's presentation is most consistent with acute complicated illness / injury requiring diagnostic workup.        FINAL CLINICAL IMPRESSION(S) / ED DIAGNOSES   Final diagnoses:  Sore throat     Rx / DC Orders   ED Discharge Orders     None        Note:  This document was prepared using Dragon voice recognition  software and may include unintentional dictation errors.   Emeline Gins 07/21/21 0940    Carrie Mew, MD 07/24/21 0001

## 2021-07-21 NOTE — Discharge Instructions (Addendum)
Please follow-up with ENT for possible laryngoscopy.  Please return for any new, worsening, or change in symptoms or other concerns.  It was a pleasure caring for you today.

## 2021-08-01 ENCOUNTER — Ambulatory Visit: Payer: Medicare Other | Admitting: Internal Medicine

## 2021-08-22 DIAGNOSIS — R070 Pain in throat: Secondary | ICD-10-CM | POA: Diagnosis not present

## 2021-08-22 DIAGNOSIS — J309 Allergic rhinitis, unspecified: Secondary | ICD-10-CM | POA: Diagnosis not present

## 2021-08-31 DIAGNOSIS — J305 Allergic rhinitis due to food: Secondary | ICD-10-CM | POA: Diagnosis not present

## 2021-08-31 DIAGNOSIS — J301 Allergic rhinitis due to pollen: Secondary | ICD-10-CM | POA: Diagnosis not present

## 2021-09-14 DIAGNOSIS — M7061 Trochanteric bursitis, right hip: Secondary | ICD-10-CM | POA: Diagnosis not present

## 2021-09-14 DIAGNOSIS — M25551 Pain in right hip: Secondary | ICD-10-CM | POA: Diagnosis not present

## 2021-09-14 DIAGNOSIS — M1611 Unilateral primary osteoarthritis, right hip: Secondary | ICD-10-CM | POA: Diagnosis not present

## 2021-09-20 ENCOUNTER — Ambulatory Visit (INDEPENDENT_AMBULATORY_CARE_PROVIDER_SITE_OTHER): Payer: Medicare Other | Admitting: Internal Medicine

## 2021-09-20 ENCOUNTER — Encounter: Payer: Self-pay | Admitting: Internal Medicine

## 2021-09-20 VITALS — BP 123/84 | HR 86 | Ht 70.0 in

## 2021-09-20 DIAGNOSIS — I10 Essential (primary) hypertension: Secondary | ICD-10-CM

## 2021-09-20 DIAGNOSIS — B356 Tinea cruris: Secondary | ICD-10-CM | POA: Insufficient documentation

## 2021-09-20 DIAGNOSIS — M159 Polyosteoarthritis, unspecified: Secondary | ICD-10-CM

## 2021-09-20 DIAGNOSIS — M7071 Other bursitis of hip, right hip: Secondary | ICD-10-CM

## 2021-09-20 MED ORDER — NYSTATIN 100000 UNIT/GM EX CREA
1.0000 | TOPICAL_CREAM | Freq: Two times a day (BID) | CUTANEOUS | 2 refills | Status: DC
Start: 2021-09-20 — End: 2021-12-01

## 2021-09-20 NOTE — Assessment & Plan Note (Signed)
Refer to orthopedics, he was advised to use Voltaren gel, at the site of pain

## 2021-09-20 NOTE — Progress Notes (Signed)
Established Patient Office Visit  Subjective:  Patient ID: Tony Mendez, male    DOB: 04-15-1940  Age: 81 y.o. MRN: [id number]  CC:  Chief Complaint  Patient presents with   Follow-up   Rash    Has rash in private area. Rash burns. First noticed x 2 weeks ago.     Rash    Tony Mendez presents for pain in the right hip rash in the groin, patient is taking Mobic and has seen the orthopedic surgeon of the right hip who gave him a shot in the right hip joint. he is not getting better. Past Medical History:  Diagnosis Date   Arthritis    BPH (benign prostatic hyperplasia)    Cancer (HCC)    Elevated PSA 02/02/2015   GERD (gastroesophageal reflux disease)    Gout    Motion sickness    Nocturia    Prostate cancer ([place])    Prostatitis     Past Surgical History:  Procedure Laterality Date   APPENDECTOMY     COLONOSCOPY WITH PROPOFOL N/A 03/20/2016   Procedure: COLONOSCOPY WITH PROPOFOL;  Surgeon: Lucilla Lame, MD;  Location: [place];  Service: Endoscopy;  Laterality: N/A;   CYSTOSCOPY  11/01/2015   Procedure: CYSTOSCOPY;  Surgeon: Hollice Espy, MD;  Location: [facility];  Service: Urology;;   POLYPECTOMY  03/20/2016   Procedure: POLYPECTOMY;  Surgeon: Lucilla Lame, MD;  Location: [place];  Service: Endoscopy;;   RADIOACTIVE SEED IMPLANT N/A 11/01/2015   Procedure: RADIOACTIVE SEED IMPLANT/BRACHYTHERAPY IMPLANT;  Surgeon: Hollice Espy, MD;  Location: [facility];  Service: Urology;  Laterality: N/A;   SKIN CANCER EXCISION     TONSILLECTOMY      Family History  Problem Relation Age of Onset   Heart disease Father    Kidney disease Father        one kidney removed   Stroke Mother    Prostate cancer Neg Hx     Social History   Socioeconomic History   Marital status: Divorced    Spouse name: Not on file   Number of children: Not on file   Years of education: Not on file   Highest education level: Not on file  Occupational History   Not  on file  Tobacco Use   Smoking status: Former    Types: Cigarettes    Quit date: 12/21/1971    Years since quitting: 49.7   Smokeless tobacco: Never  Substance and Sexual Activity   Alcohol use: Yes    Alcohol/week: 21.0 standard drinks of alcohol    Types: 7 Glasses of wine, 14 Cans of beer per week    Comment: 23 oz a day   Drug use: No   Sexual activity: Not on file  Other Topics Concern   Not on file  Social History Narrative   Not on file   Social Determinants of Health   Financial Resource Strain: Not on file  Food Insecurity: Not on file  Transportation Needs: Not on file  Physical Activity: Not on file  Stress: Not on file  Social Connections: Not on file  Intimate Partner Violence: Not on file     Current Outpatient Medications:    nystatin cream (MYCOSTATIN), Apply 1 Application topically 2 (two) times daily., Disp: 30 g, Rfl: 2   acetaminophen (TYLENOL) 650 MG CR tablet, One tab  as needed every 8 hours for pain, 90 tabs, Disp: , Rfl:    aspirin 81 MG tablet, Take  81 mg by mouth daily. Reported on 02/02/2015, Disp: , Rfl:    CINNAMON PO, Take by mouth daily., Disp: , Rfl:    docusate sodium (COLACE) 100 MG capsule, Take 100 mg by mouth 2 (two) times daily., Disp: , Rfl:    GARLIC PO, Take by mouth., Disp: , Rfl:    lisinopril (ZESTRIL) 20 MG tablet, TAKE 1 TABLET BY MOUTH DAILY, Disp: 90 tablet, Rfl: 3   magic mouthwash (nystatin, lidocaine, diphenhydrAMINE, alum & mag hydroxide) suspension, Swish and spit 5 mLs 3 (three) times daily as needed for mouth pain., Disp: 180 mL, Rfl: 1   meloxicam (MOBIC) 7.5 MG tablet, TAKE 1 TABLET BY MOUTH DAILY, Disp: 30 tablet, Rfl: 3   Multiple Vitamin (MULTIVITAMIN) tablet, Take 1 tablet by mouth daily., Disp: , Rfl:    pantoprazole (PROTONIX) 40 MG tablet, TAKE 1 TABLET BY MOUTH DAILY, Disp: 90 tablet, Rfl: 3   sildenafil (VIAGRA) 50 MG tablet, TK 1 T PO PRN, Disp: , Rfl:    Allergies  Allergen Reactions   Flomax [Tamsulosin  Hcl] Other (See Comments)    Dizziness    ROS Review of Systems  Constitutional: Negative.   HENT: Negative.    Eyes: Negative.   Respiratory: Negative.    Cardiovascular: Negative.   Gastrointestinal: Negative.   Endocrine: Negative.   Genitourinary: Negative.   Musculoskeletal: Negative.        Right hip pain.    Skin:  Positive for rash.  Allergic/Immunologic: Negative.   Neurological: Negative.   Hematological: Negative.   Psychiatric/Behavioral: Negative.    All other systems reviewed and are negative.     Objective:    Physical Exam Vitals reviewed.  Constitutional:      Appearance: Normal appearance.  HENT:     Mouth/Throat:     Mouth: Mucous membranes are moist.  Eyes:     Pupils: Pupils are equal, round, and reactive to light.  Neck:     Vascular: No carotid bruit.  Cardiovascular:     Rate and Rhythm: Normal rate and regular rhythm.     Pulses: Normal pulses.     Heart sounds: Normal heart sounds.  Pulmonary:     Effort: Pulmonary effort is normal.     Breath sounds: Normal breath sounds.  Abdominal:     General: Bowel sounds are normal.     Palpations: Abdomen is soft. There is no hepatomegaly, splenomegaly or mass.     Tenderness: There is no abdominal tenderness.     Hernia: No hernia is present.  Musculoskeletal:     Cervical back: Neck supple.     Right lower leg: No edema.     Left lower leg: No edema.     Comments: Pain in the right hip.  Skin:    Findings: No rash.  Neurological:     Mental Status: He is alert and oriented to person, place, and time.     Motor: No weakness.  Psychiatric:        Mood and Affect: Mood normal.        Behavior: Behavior normal.     BP 123/84   Pulse 86   Ht 5' 10"  (1.778 m)   BMI 27.26 kg/m  Wt Readings from Last 3 Encounters:  07/21/21 190 lb (86.2 kg)  07/13/21 191 lb (86.6 kg)  07/01/21 191 lb (86.6 kg)     Health Maintenance Due  Topic Date Due   Hepatitis C Screening  Never done  Zoster Vaccines- Shingrix (1 of 2) Never done   Pneumonia Vaccine 60+ Years old (1 - PCV) Never done   COLONOSCOPY (Pts 45-53yr Insurance coverage will need to be confirmed)  03/21/2019   COVID-19 Vaccine (4 - Pfizer risk series) 01/01/2020   INFLUENZA VACCINE  08/16/2021    There are no preventive care reminders to display for this patient.  Lab Results  Component Value Date   TSH 1.37 08/10/2020   Lab Results  Component Value Date   WBC 6.0 07/21/2021   HGB 15.3 07/21/2021   HCT 45.4 07/21/2021   MCV 96.4 07/21/2021   PLT 262 07/21/2021   Lab Results  Component Value Date   NA 139 07/21/2021   K 4.7 07/21/2021   CO2 29 07/21/2021   GLUCOSE 101 (H) 07/21/2021   BUN 15 07/21/2021   CREATININE 1.14 07/21/2021   BILITOT 1.0 08/10/2020   ALKPHOS 58 02/13/2015   AST 22 08/10/2020   ALT 18 08/10/2020   PROT 6.6 08/10/2020   ALBUMIN 4.2 02/13/2015   CALCIUM 9.5 07/21/2021   ANIONGAP 4 (L) 07/21/2021   EGFR 62 08/10/2020   Lab Results  Component Value Date   CHOL 203 (H) 08/10/2020   Lab Results  Component Value Date   HDL 53 08/10/2020   Lab Results  Component Value Date   LDLCALC 123 (H) 08/10/2020   Lab Results  Component Value Date   TRIG 157 (H) 08/10/2020   Lab Results  Component Value Date   CHOLHDL 3.8 08/10/2020   No results found for: "HGBA1C"    Assessment & Plan:   Problem List Items Addressed This Visit       Cardiovascular and Mediastinum   Primary hypertension - Primary     Patient denies any chest pain or shortness of breath there is no history of palpitation or paroxysmal nocturnal dyspnea   patient was advised to follow low-salt low-cholesterol diet    ideally I want to keep systolic blood pressure below 130 mmHg, patient was asked to check blood pressure one times a week and give me a report on that.  Patient will be follow-up in 3 months  or earlier as needed, patient will call me back for any change in the cardiovascular  symptoms Patient was advised to buy a book from local bookstore concerning blood pressure and read several chapters  every day.  This will be supplemented by some of the material we will give him from the office.  Patient should also utilize other resources like YouTube and Internet to learn more about the blood pressure and the diet.        Musculoskeletal and Integument   Primary osteoarthritis involving multiple joints    Continue Mobic for 2 weeks      Jock itch   Relevant Medications   nystatin cream (MYCOSTATIN)   Iliopsoas bursitis of right hip    Refer to orthopedics, he was advised to use Voltaren gel, at the site of pain       Meds ordered this encounter  Medications   nystatin cream (MYCOSTATIN)    Sig: Apply 1 Application topically 2 (two) times daily.    Dispense:  30 g    Refill:  2    Follow-up: No follow-ups on file.    JCletis Athens MD

## 2021-09-20 NOTE — Assessment & Plan Note (Signed)
Continue Mobic for 2 weeks

## 2021-09-20 NOTE — Assessment & Plan Note (Signed)

## 2021-10-13 ENCOUNTER — Ambulatory Visit (INDEPENDENT_AMBULATORY_CARE_PROVIDER_SITE_OTHER): Payer: Medicare Other | Admitting: Nurse Practitioner

## 2021-10-13 ENCOUNTER — Encounter: Payer: Self-pay | Admitting: Nurse Practitioner

## 2021-10-13 VITALS — BP 133/78 | HR 74 | Ht 70.0 in | Wt 190.9 lb

## 2021-10-13 DIAGNOSIS — M109 Gout, unspecified: Secondary | ICD-10-CM | POA: Diagnosis not present

## 2021-10-13 MED ORDER — IBUPROFEN 800 MG PO TABS: 800.0000 mg | ORAL_TABLET | Freq: Four times a day (QID) | ORAL | 0 refills | Status: AC | PRN

## 2021-10-13 NOTE — Progress Notes (Signed)
Established Patient Office Visit  Subjective:  Patient ID: Tony Mendez, male    DOB: 05-17-1940  Age: 81 y.o. MRN: [id number]  CC:  Chief Complaint  Patient presents with   Gout    Patient has gout of left foot in his big toe. Patient first notice symptoms x 2 days ago.      HPI  Tony Mendez presents pain in left big toe.  Symptoms started 2 days ago patient states that he had gout in the past.   HPI   Past Medical History:  Diagnosis Date   Arthritis    BPH (benign prostatic hyperplasia)    Cancer (HCC)    Elevated PSA 02/02/2015   GERD (gastroesophageal reflux disease)    Gout    Motion sickness    Nocturia    Prostate cancer ([place])    Prostatitis     Past Surgical History:  Procedure Laterality Date   APPENDECTOMY     COLONOSCOPY WITH PROPOFOL N/A 03/20/2016   Procedure: COLONOSCOPY WITH PROPOFOL;  Surgeon: Lucilla Lame, MD;  Location: [place];  Service: Endoscopy;  Laterality: N/A;   CYSTOSCOPY  11/01/2015   Procedure: CYSTOSCOPY;  Surgeon: Hollice Espy, MD;  Location: [facility];  Service: Urology;;   POLYPECTOMY  03/20/2016   Procedure: POLYPECTOMY;  Surgeon: Lucilla Lame, MD;  Location: [place];  Service: Endoscopy;;   RADIOACTIVE SEED IMPLANT N/A 11/01/2015   Procedure: RADIOACTIVE SEED IMPLANT/BRACHYTHERAPY IMPLANT;  Surgeon: Hollice Espy, MD;  Location: [facility];  Service: Urology;  Laterality: N/A;   SKIN CANCER EXCISION     TONSILLECTOMY      Family History  Problem Relation Age of Onset   Heart disease Father    Kidney disease Father        one kidney removed   Stroke Mother    Prostate cancer Neg Hx     Social History   Socioeconomic History   Marital status: Divorced    Spouse name: Not on file   Number of children: Not on file   Years of education: Not on file   Highest education level: Not on file  Occupational History   Not on file  Tobacco Use   Smoking status: Former    Types: Cigarettes    Quit  date: 12/21/1971    Years since quitting: 49.8   Smokeless tobacco: Never  Substance and Sexual Activity   Alcohol use: Yes    Alcohol/week: 21.0 standard drinks of alcohol    Types: 7 Glasses of wine, 14 Cans of beer per week    Comment: 23 oz a day   Drug use: No   Sexual activity: Not on file  Other Topics Concern   Not on file  Social History Narrative   Not on file   Social Determinants of Health   Financial Resource Strain: Not on file  Food Insecurity: Not on file  Transportation Needs: Not on file  Physical Activity: Not on file  Stress: Not on file  Social Connections: Not on file  Intimate Partner Violence: Not on file     Outpatient Medications Prior to Visit  Medication Sig Dispense Refill   acetaminophen (TYLENOL) 650 MG CR tablet One tab  as needed every 8 hours for pain, 90 tabs     aspirin 81 MG tablet Take 81 mg by mouth daily. Reported on 02/02/2015     CINNAMON PO Take by mouth daily.     docusate sodium (COLACE) 100 MG  capsule Take 100 mg by mouth 2 (two) times daily.     GARLIC PO Take by mouth.     lisinopril (ZESTRIL) 20 MG tablet TAKE 1 TABLET BY MOUTH DAILY 90 tablet 3   magic mouthwash (nystatin, lidocaine, diphenhydrAMINE, alum & mag hydroxide) suspension Swish and spit 5 mLs 3 (three) times daily as needed for mouth pain. 180 mL 1   Multiple Vitamin (MULTIVITAMIN) tablet Take 1 tablet by mouth daily.     nystatin cream (MYCOSTATIN) Apply 1 Application topically 2 (two) times daily. 30 g 2   pantoprazole (PROTONIX) 40 MG tablet TAKE 1 TABLET BY MOUTH DAILY 90 tablet 3   sildenafil (VIAGRA) 50 MG tablet TK 1 T PO PRN     meloxicam (MOBIC) 7.5 MG tablet TAKE 1 TABLET BY MOUTH DAILY 30 tablet 3   No facility-administered medications prior to visit.    Allergies  Allergen Reactions   Flomax [Tamsulosin Hcl] Other (See Comments)    Dizziness    ROS Review of Systems  Constitutional: Negative.   HENT: Negative.    Respiratory:  Negative for  chest tightness and shortness of breath.   Cardiovascular:  Negative for chest pain and palpitations.  Gastrointestinal: Negative.   Musculoskeletal:  Positive for joint swelling (left big toe).  Psychiatric/Behavioral:  Negative for agitation, behavioral problems and confusion.       Objective:    Physical Exam Constitutional:      Appearance: Normal appearance. He is overweight.  HENT:     Right Ear: Tympanic membrane normal.     Left Ear: Tympanic membrane normal.     Mouth/Throat:     Mouth: Mucous membranes are moist.  Eyes:     Extraocular Movements: Extraocular movements intact.     Conjunctiva/sclera: Conjunctivae normal.     Pupils: Pupils are equal, round, and reactive to light.  Cardiovascular:     Rate and Rhythm: Normal rate and regular rhythm.     Pulses: Normal pulses.     Heart sounds: No murmur heard.    No friction rub.  Pulmonary:     Effort: Pulmonary effort is normal.  Abdominal:     General: Bowel sounds are normal.     Palpations: Abdomen is soft. There is no mass.     Tenderness: There is no abdominal tenderness.  Musculoskeletal:        General: Normal range of motion.     Left foot: Swelling (slight swelling in left big toe) present.  Skin:    General: Skin is warm.  Neurological:     Mental Status: He is alert.     BP 133/78   Pulse 74   Ht 5' 10"  (1.778 m)   Wt 190 lb 14.4 oz (86.6 kg)   BMI 27.39 kg/m  Wt Readings from Last 3 Encounters:  10/13/21 190 lb 14.4 oz (86.6 kg)  07/21/21 190 lb (86.2 kg)  07/13/21 191 lb (86.6 kg)     Health Maintenance  Topic Date Due   COLONOSCOPY (Pts 45-89yr Insurance coverage will need to be confirmed)  03/21/2019   COVID-19 Vaccine (4 - Pfizer risk series) 01/01/2020   Zoster Vaccines- Shingrix (1 of 2) 01/12/2022 (Originally 01/15/1960)   Pneumonia Vaccine 79 Years old (1 - PCV) 10/14/2022 (Originally 01/16/2008)   Hepatitis C Screening  10/14/2022 (Originally 01/15/1959)   TETANUS/TDAP   10/09/2024   INFLUENZA VACCINE  Completed   HPV VACCINES  Aged Out    There are no preventive care  reminders to display for this patient.  Lab Results  Component Value Date   TSH 1.37 08/10/2020   Lab Results  Component Value Date   WBC 6.0 07/21/2021   HGB 15.3 07/21/2021   HCT 45.4 07/21/2021   MCV 96.4 07/21/2021   PLT 262 07/21/2021   Lab Results  Component Value Date   NA 139 07/21/2021   K 4.7 07/21/2021   CO2 29 07/21/2021   GLUCOSE 101 (H) 07/21/2021   BUN 15 07/21/2021   CREATININE 1.14 07/21/2021   BILITOT 1.0 08/10/2020   ALKPHOS 58 02/13/2015   AST 22 08/10/2020   ALT 18 08/10/2020   PROT 6.6 08/10/2020   ALBUMIN 4.2 02/13/2015   CALCIUM 9.5 07/21/2021   ANIONGAP 4 (L) 07/21/2021   EGFR 62 08/10/2020   Lab Results  Component Value Date   CHOL 203 (H) 08/10/2020   Lab Results  Component Value Date   HDL 53 08/10/2020   Lab Results  Component Value Date   LDLCALC 123 (H) 08/10/2020   Lab Results  Component Value Date   TRIG 157 (H) 08/10/2020   Lab Results  Component Value Date   CHOLHDL 3.8 08/10/2020   No results found for: "HGBA1C"    Assessment & Plan:   Problem List Items Addressed This Visit       Musculoskeletal and Integument   Acute gout involving toe of left foot - Primary    Started him on ibuprofen 800 mg every 6 hours as needed for pain. Advised patient to avoid the intake of food containing purine like seafood, alcohol and organ meat.       Relevant Medications   ibuprofen (ADVIL) 800 MG tablet     Meds ordered this encounter  Medications   ibuprofen (ADVIL) 800 MG tablet    Sig: Take 1 tablet (800 mg total) by mouth every 6 (six) hours as needed.    Dispense:  30 tablet    Refill:  0     Follow-up: Return if symptoms worsen or fail to improve.    Theresia Lo, NP

## 2021-10-13 NOTE — Assessment & Plan Note (Signed)
Started him on ibuprofen 800 mg every 6 hours as needed for pain. Advised patient to avoid the intake of food containing purine like seafood, alcohol and organ meat.

## 2021-12-01 ENCOUNTER — Ambulatory Visit (INDEPENDENT_AMBULATORY_CARE_PROVIDER_SITE_OTHER): Payer: Medicare Other | Admitting: Nurse Practitioner

## 2021-12-01 VITALS — BP 126/69 | HR 74 | Temp 97.4°F | Wt 192.6 lb

## 2021-12-01 DIAGNOSIS — J069 Acute upper respiratory infection, unspecified: Secondary | ICD-10-CM

## 2021-12-01 MED ORDER — NYSTATIN 100000 UNIT/GM EX CREA: 1.0000 | TOPICAL_CREAM | Freq: Two times a day (BID) | CUTANEOUS | 0 refills | Status: AC

## 2021-12-01 MED ORDER — BENZONATATE 100 MG PO CAPS: 100.0000 mg | ORAL_CAPSULE | Freq: Two times a day (BID) | ORAL | 0 refills | Status: DC | PRN

## 2021-12-01 NOTE — Progress Notes (Signed)
Established Patient Office Visit  Subjective:  Patient ID: Tony Mendez, male    DOB: 1940-06-26  Age: 81 y.o. MRN: [id number]  CC:  Chief Complaint  Patient presents with   Cough    Coughing-sometimes dry and sometimes productive, worse at night, sneezing. No fever, N & V, headache or fever, no sore throat     HPI  Tony Mendez presents for:  Cough This is a new problem. The current episode started in the past 7 days. The problem has been gradually worsening. The problem occurs constantly. The cough is Productive of sputum. Associated symptoms include postnasal drip and rhinorrhea. Pertinent negatives include no chills, ear pain or sore throat. Nothing aggravates the symptoms. He has tried nothing for the symptoms.     Past Medical History:  Diagnosis Date   Arthritis    BPH (benign prostatic hyperplasia)    Cancer (HCC)    Elevated PSA 02/02/2015   GERD (gastroesophageal reflux disease)    Gout    Motion sickness    Nocturia    Prostate cancer ([place])    Prostatitis     Past Surgical History:  Procedure Laterality Date   APPENDECTOMY     COLONOSCOPY WITH PROPOFOL N/A 03/20/2016   Procedure: COLONOSCOPY WITH PROPOFOL;  Surgeon: Lucilla Lame, MD;  Location: [facility];  Service: Endoscopy;  Laterality: N/A;   CYSTOSCOPY  11/01/2015   Procedure: CYSTOSCOPY;  Surgeon: Hollice Espy, MD;  Location: [facility];  Service: Urology;;   POLYPECTOMY  03/20/2016   Procedure: POLYPECTOMY;  Surgeon: Lucilla Lame, MD;  Location: [facility];  Service: Endoscopy;;   RADIOACTIVE SEED IMPLANT N/A 11/01/2015   Procedure: RADIOACTIVE SEED IMPLANT/BRACHYTHERAPY IMPLANT;  Surgeon: Hollice Espy, MD;  Location: [facility];  Service: Urology;  Laterality: N/A;   SKIN CANCER EXCISION     TONSILLECTOMY      Family History  Problem Relation Age of Onset   Heart disease Father    Kidney disease Father        one kidney removed   Stroke Mother    Prostate cancer Neg Hx      Social History   Socioeconomic History   Marital status: Divorced    Spouse name: Not on file   Number of children: Not on file   Years of education: Not on file   Highest education level: Not on file  Occupational History   Not on file  Tobacco Use   Smoking status: Former    Types: Cigarettes    Quit date: 12/21/1971    Years since quitting: 49.9   Smokeless tobacco: Never  Substance and Sexual Activity   Alcohol use: Yes    Alcohol/week: 21.0 standard drinks of alcohol    Types: 7 Glasses of wine, 14 Cans of beer per week    Comment: 23 oz a day   Drug use: No   Sexual activity: Not on file  Other Topics Concern   Not on file  Social History Narrative   Not on file   Social Determinants of Health   Financial Resource Strain: Not on file  Food Insecurity: Not on file  Transportation Needs: Not on file  Physical Activity: Not on file  Stress: Not on file  Social Connections: Not on file  Intimate Partner Violence: Not on file     Outpatient Medications Prior to Visit  Medication Sig Dispense Refill   acetaminophen (TYLENOL) 650 MG CR tablet One tab  as needed every 8  hours for pain, 90 tabs     aspirin 81 MG tablet Take 81 mg by mouth daily. Reported on 02/02/2015     CINNAMON PO Take by mouth daily.     docusate sodium (COLACE) 100 MG capsule Take 100 mg by mouth 2 (two) times daily.     ibuprofen (ADVIL) 800 MG tablet Take 1 tablet (800 mg total) by mouth every 6 (six) hours as needed. 30 tablet 0   lisinopril (ZESTRIL) 20 MG tablet TAKE 1 TABLET BY MOUTH DAILY 90 tablet 3   Multiple Vitamin (MULTIVITAMIN) tablet Take 1 tablet by mouth daily.     pantoprazole (PROTONIX) 40 MG tablet TAKE 1 TABLET BY MOUTH DAILY 90 tablet 3   sildenafil (VIAGRA) 50 MG tablet TK 1 T PO PRN     magic mouthwash (nystatin, lidocaine, diphenhydrAMINE, alum & mag hydroxide) suspension Swish and spit 5 mLs 3 (three) times daily as needed for mouth pain. 180 mL 1   nystatin cream  (MYCOSTATIN) Apply 1 Application topically 2 (two) times daily. 30 g 2   GARLIC PO Take by mouth. (Patient not taking: Reported on 12/01/2021)     No facility-administered medications prior to visit.    Allergies  Allergen Reactions   Flomax [Tamsulosin Hcl] Other (See Comments)    Dizziness    ROS Review of Systems  Constitutional:  Negative for chills.  HENT:  Positive for postnasal drip and rhinorrhea. Negative for ear pain and sore throat.   Eyes: Negative.   Respiratory:  Positive for cough.   Cardiovascular: Negative.   Gastrointestinal: Negative.   Genitourinary: Negative.   Musculoskeletal: Negative.   Neurological: Negative.   Psychiatric/Behavioral: Negative.        Objective:    Physical Exam Constitutional:      Appearance: Normal appearance. He is normal weight.  HENT:     Right Ear: Tympanic membrane normal.     Left Ear: Tympanic membrane normal.     Nose: Rhinorrhea present.  Eyes:     Pupils: Pupils are equal, round, and reactive to light.  Cardiovascular:     Rate and Rhythm: Normal rate and regular rhythm.     Pulses: Normal pulses.     Heart sounds: Normal heart sounds. No murmur heard.    No friction rub.  Pulmonary:     Effort: Pulmonary effort is normal.     Breath sounds: Normal breath sounds.  Abdominal:     General: Bowel sounds are normal.     Palpations: Abdomen is soft.  Neurological:     General: No focal deficit present.     Mental Status: He is alert and oriented to person, place, and time. Mental status is at baseline.  Psychiatric:        Mood and Affect: Mood normal.        Behavior: Behavior normal.        Thought Content: Thought content normal.        Judgment: Judgment normal.     BP 126/69   Pulse 74   Temp (!) 97.4 F (36.3 C)   Wt 192 lb 9.6 oz (87.4 kg)   BMI 27.64 kg/m  Wt Readings from Last 3 Encounters:  12/01/21 192 lb 9.6 oz (87.4 kg)  10/13/21 190 lb 14.4 oz (86.6 kg)  07/21/21 190 lb (86.2 kg)      Health Maintenance  Topic Date Due   Medicare Annual Wellness (AWV)  08/09/2018   COLONOSCOPY (Pts 45-44yr Insurance coverage  will need to be confirmed)  03/21/2019   COVID-19 Vaccine (4 - 2023-24 season) 09/16/2021   Zoster Vaccines- Shingrix (1 of 2) 01/12/2022 (Originally 01/15/1960)   Pneumonia Vaccine 9+ Years old (1 - PCV) 10/14/2022 (Originally 01/16/2008)   Hepatitis C Screening  10/14/2022 (Originally 01/15/1959)   INFLUENZA VACCINE  Completed   HPV VACCINES  Aged Out    There are no preventive care reminders to display for this patient.  Lab Results  Component Value Date   TSH 1.37 08/10/2020   Lab Results  Component Value Date   WBC 6.0 07/21/2021   HGB 15.3 07/21/2021   HCT 45.4 07/21/2021   MCV 96.4 07/21/2021   PLT 262 07/21/2021   Lab Results  Component Value Date   NA 139 07/21/2021   K 4.7 07/21/2021   CO2 29 07/21/2021   GLUCOSE 101 (H) 07/21/2021   BUN 15 07/21/2021   CREATININE 1.14 07/21/2021   BILITOT 1.0 08/10/2020   ALKPHOS 58 02/13/2015   AST 22 08/10/2020   ALT 18 08/10/2020   PROT 6.6 08/10/2020   ALBUMIN 4.2 02/13/2015   CALCIUM 9.5 07/21/2021   ANIONGAP 4 (L) 07/21/2021   EGFR 62 08/10/2020   Lab Results  Component Value Date   CHOL 203 (H) 08/10/2020   Lab Results  Component Value Date   HDL 53 08/10/2020   Lab Results  Component Value Date   LDLCALC 123 (H) 08/10/2020   Lab Results  Component Value Date   TRIG 157 (H) 08/10/2020   Lab Results  Component Value Date   CHOLHDL 3.8 08/10/2020   No results found for: "HGBA1C"    Assessment & Plan:   Problem List Items Addressed This Visit       Respiratory   Viral upper respiratory tract infection - Primary    Advised patient to increase hydration. Advised him to elevate the head of the bed while sleeping. Started him on benzonatate       Relevant Medications   nystatin cream (MYCOSTATIN)     Meds ordered this encounter  Medications   benzonatate  (TESSALON) 100 MG capsule    Sig: Take 1 capsule (100 mg total) by mouth 2 (two) times daily as needed for cough.    Dispense:  20 capsule    Refill:  0   nystatin cream (MYCOSTATIN)    Sig: Apply 1 Application topically 2 (two) times daily.    Dispense:  30 g    Refill:  0     Follow-up: No follow-ups on file.    Theresia Lo, NP

## 2021-12-06 ENCOUNTER — Encounter: Payer: Self-pay | Admitting: Nurse Practitioner

## 2021-12-06 NOTE — Assessment & Plan Note (Signed)
Advised patient to increase hydration. Advised him to elevate the head of the bed while sleeping. Started him on benzonatate

## 2022-02-06 ENCOUNTER — Other Ambulatory Visit: Payer: Self-pay | Admitting: Internal Medicine

## 2022-05-05 DIAGNOSIS — M25562 Pain in left knee: Secondary | ICD-10-CM | POA: Diagnosis not present

## 2022-05-05 DIAGNOSIS — M25561 Pain in right knee: Secondary | ICD-10-CM | POA: Diagnosis not present

## 2022-05-05 DIAGNOSIS — M170 Bilateral primary osteoarthritis of knee: Secondary | ICD-10-CM | POA: Diagnosis not present

## 2022-05-05 DIAGNOSIS — G8929 Other chronic pain: Secondary | ICD-10-CM | POA: Diagnosis not present

## 2022-07-06 ENCOUNTER — Other Ambulatory Visit: Payer: Self-pay | Admitting: *Deleted

## 2022-07-06 DIAGNOSIS — C61 Malignant neoplasm of prostate: Secondary | ICD-10-CM

## 2022-07-07 ENCOUNTER — Other Ambulatory Visit: Payer: Medicare Other

## 2022-07-07 DIAGNOSIS — C61 Malignant neoplasm of prostate: Secondary | ICD-10-CM

## 2022-07-08 LAB — PSA: Prostate Specific Ag, Serum: 0.1 ng/mL (ref 0.0–4.0)

## 2022-07-12 ENCOUNTER — Ambulatory Visit: Payer: Medicare Other | Admitting: Urology

## 2022-07-17 NOTE — Progress Notes (Deleted)
07/17/22 1:16 PM   TYESON TEITEL 01/08/1941 [id number]  Referring provider:  Corky Downs, MD [address] [place],  [place] [address]  No chief complaint on file.  Urological history  Prostate cancer  -PSA (06/2022) 0.1 -Prostate cancer s/p brachytherapy on 11/01/15. -Initially dx with Gleason 3+3 prostate cancer previously on active surveillance who underwent prostate MRI which showed a concerning area at the right base for high grade carcinoma.   2. BPH with LUTS  -***  3. ED  - Contributing factors are  age 82, pelvic radiation, BPH and prostate cancer -failed tadalafil   HPI: Tony Mendez is a 82 y.o.male who presents today for one year follow.   I PSS ***    Score:  1-7 Mild 8-19 Moderate 20-35 Severe     PMH: Past Medical History:  Diagnosis Date   Arthritis    BPH (benign prostatic hyperplasia)    Cancer (HCC)    Elevated PSA 02/02/2015   GERD (gastroesophageal reflux disease)    Gout    Motion sickness    Nocturia    Prostate cancer (HCC)    Prostatitis     Surgical History: Past Surgical History:  Procedure Laterality Date   APPENDECTOMY     COLONOSCOPY WITH PROPOFOL N/A 03/20/2016   Procedure: COLONOSCOPY WITH PROPOFOL;  Surgeon: Midge Minium, MD;  Location: [facility];  Service: Endoscopy;  Laterality: N/A;   CYSTOSCOPY  11/01/2015   Procedure: CYSTOSCOPY;  Surgeon: Vanna Scotland, MD;  Location: [facility];  Service: Urology;;   POLYPECTOMY  03/20/2016   Procedure: POLYPECTOMY;  Surgeon: Midge Minium, MD;  Location: [facility];  Service: Endoscopy;;   RADIOACTIVE SEED IMPLANT N/A 11/01/2015   Procedure: RADIOACTIVE SEED IMPLANT/BRACHYTHERAPY IMPLANT;  Surgeon: Vanna Scotland, MD;  Location: [facility];  Service: Urology;  Laterality: N/A;   SKIN CANCER EXCISION     TONSILLECTOMY      Home Medications:  Allergies as of 07/18/2022       Reactions   Flomax [tamsulosin Hcl] Other (See Comments)   Dizziness         Medication List        Accurate as of July 17, 2022  1:16 PM. If you have any questions, ask your nurse or doctor.          acetaminophen 650 MG CR tablet Commonly known as: TYLENOL One tab  as needed every 8 hours for pain, 90 tabs   aspirin 81 MG tablet Take 81 mg by mouth daily. Reported on 02/02/2015   benzonatate 100 MG capsule Commonly known as: TESSALON Take 1 capsule (100 mg total) by mouth 2 (two) times daily as needed for cough.   CINNAMON PO Take by mouth daily.   docusate sodium 100 MG capsule Commonly known as: COLACE Take 100 mg by mouth 2 (two) times daily.   ibuprofen 800 MG tablet Commonly known as: ADVIL Take 1 tablet (800 mg total) by mouth every 6 (six) hours as needed.   lisinopril 20 MG tablet Commonly known as: ZESTRIL TAKE 1 TABLET BY MOUTH DAILY   multivitamin tablet Take 1 tablet by mouth daily.   nystatin cream Commonly known as: MYCOSTATIN Apply 1 Application topically 2 (two) times daily.   pantoprazole 40 MG tablet Commonly known as: PROTONIX TAKE 1 TABLET BY MOUTH DAILY   sildenafil 50 MG tablet Commonly known as: VIAGRA TK 1 T PO PRN        Allergies:  Allergies  Allergen Reactions  Flomax [Tamsulosin Hcl] Other (See Comments)    Dizziness    Family History: Family History  Problem Relation Age of Onset   Heart disease Father    Kidney disease Father        one kidney removed   Stroke Mother    Prostate cancer Neg Hx     Social History:  reports that he quit smoking about 50 years ago. His smoking use included cigarettes. He has never used smokeless tobacco. He reports current alcohol use of about 21.0 standard drinks of alcohol per week. He reports that he does not use drugs.   Physical Exam: There were no vitals taken for this visit.  Constitutional:  Well nourished. Alert and oriented, No acute distress. HEENT: [place] AT, moist mucus membranes.  Trachea midline Cardiovascular: No clubbing, cyanosis, or  edema. Respiratory: Normal respiratory effort, no increased work of breathing. GU: No CVA tenderness.  No bladder fullness or masses.  Patient with circumcised/uncircumcised phallus. ***Foreskin easily retracted***  Urethral meatus is patent.  No penile discharge. No penile lesions or rashes. Scrotum without lesions, cysts, rashes and/or edema.  Testicles are located scrotally bilaterally. No masses are appreciated in the testicles. Left and right epididymis are normal. Rectal: Patient with  normal sphincter tone. Anus and perineum without scarring or rashes. No rectal masses are appreciated. Prostate is approximately *** grams, *** nodules are appreciated. Seminal vesicles are normal. Neurologic: Grossly intact, no focal deficits, moving all 4 extremities. Psychiatric: Normal mood and affect.   Laboratory Data: Results for orders placed or performed in visit on 07/07/22  PSA  Result Value Ref Range   Prostate Specific Ag, Serum 0.1 0.0 - 4.0 ng/mL  I have reviewed the labs.     Assessment & Plan:    1. Prostate cancer [facility]) - S/p brachytherapy on 10/2015 for prostate cancer progression with possible high grade lesion on MRI  - PSA with appropriate response - He has some urinary frequency/ urgency with minimal bother   2. BPH with LUTS -PSA stable  -DRE benign *** -UA benign *** -PVR < 300 cc *** -symptoms - *** -most bothersome symptoms are *** -continue conservative management, avoiding bladder irritants and timed voiding's -Initiate alpha-blocker (***), discussed side effects *** -Initiate 5 alpha reductase inhibitor (***), discussed side effects *** -Continue tamsulosin 0.4 mg daily, alfuzosin 10 mg daily, Rapaflo 8 mg daily, terazosin, doxazosin, Cialis 5 mg daily and finasteride 5 mg daily, dutasteride 0.5 mg daily***:refills given -Cannot tolerate medication or medication failure, schedule cystoscopy ***    3. Erectile dysfunction -not sexually active at this time  No  follow-ups on file.  Cloretta Ned   [facility] [address], [address] [place], [place] [address] [phone]

## 2022-07-18 ENCOUNTER — Ambulatory Visit: Payer: Medicare Other | Admitting: Urology

## 2022-07-18 ENCOUNTER — Encounter: Payer: Self-pay | Admitting: Urology

## 2022-07-18 VITALS — BP 123/65 | HR 92 | Ht 70.0 in | Wt 192.0 lb

## 2022-07-18 DIAGNOSIS — N401 Enlarged prostate with lower urinary tract symptoms: Secondary | ICD-10-CM | POA: Diagnosis not present

## 2022-07-18 DIAGNOSIS — C61 Malignant neoplasm of prostate: Secondary | ICD-10-CM | POA: Diagnosis not present

## 2022-07-18 DIAGNOSIS — R351 Nocturia: Secondary | ICD-10-CM

## 2022-07-18 DIAGNOSIS — N529 Male erectile dysfunction, unspecified: Secondary | ICD-10-CM

## 2022-07-18 NOTE — Progress Notes (Signed)
07/18/22 3:59 PM   Tony Mendez 1940-10-11 [id number]  Referring provider:  Corky Downs, MD [address] [place],  [place] [address]  Chief Complaint  Patient presents with   Follow-up   Urological history  Prostate cancer  -PSA (06/2022) 0.1 -Prostate cancer s/p brachytherapy on 11/01/15. -Initially dx with Gleason 3+3 prostate cancer previously on active surveillance who underwent prostate MRI which showed a concerning area at the right base for high grade carcinoma.   2. BPH with LUTS   3. ED  - Contributing factors are  age, pelvic radiation, BPH and prostate cancer -failed tadalafil   HPI: Tony Mendez is a 82 y.o.male who presents today for one year follow.   I PSS 11/3  He has nocturia x 3, but he is already awake for other reasons so it is not bothersome to him.  Patient denies any modifying or aggravating factors.  Patient denies any recent UTI's, gross hematuria, dysuria or suprapubic/flank pain.  Patient denies any fevers, chills, nausea or vomiting.    IPSS     Row Name 07/18/22 1500         International Prostate Symptom Score   How often have you had the sensation of not emptying your bladder? Less than 1 in 5     How often have you had to urinate less than every two hours? Less than 1 in 5 times     How often have you found you stopped and started again several times when you urinated? Less than 1 in 5 times     How often have you found it difficult to postpone urination? Less than 1 in 5 times     How often have you had a weak urinary stream? Less than half the time     How often have you had to strain to start urination? Less than half the time     How many times did you typically get up at night to urinate? 3 Times     Total IPSS Score 11       Quality of Life due to urinary symptoms   If you were to spend the rest of your life with your urinary condition just the way it is now how would you feel about that? Mixed                Score:  1-7 Mild 8-19 Moderate 20-35 Severe     PMH: Past Medical History:  Diagnosis Date   Arthritis    BPH (benign prostatic hyperplasia)    Cancer (HCC)    Elevated PSA 02/02/2015   GERD (gastroesophageal reflux disease)    Gout    Motion sickness    Nocturia    Prostate cancer (HCC)    Prostatitis     Surgical History: Past Surgical History:  Procedure Laterality Date   APPENDECTOMY     COLONOSCOPY WITH PROPOFOL N/A 03/20/2016   Procedure: COLONOSCOPY WITH PROPOFOL;  Surgeon: Midge Minium, MD;  Location: [facility];  Service: Endoscopy;  Laterality: N/A;   CYSTOSCOPY  11/01/2015   Procedure: CYSTOSCOPY;  Surgeon: Vanna Scotland, MD;  Location: [facility];  Service: Urology;;   POLYPECTOMY  03/20/2016   Procedure: POLYPECTOMY;  Surgeon: Midge Minium, MD;  Location: [facility];  Service: Endoscopy;;   RADIOACTIVE SEED IMPLANT N/A 11/01/2015   Procedure: RADIOACTIVE SEED IMPLANT/BRACHYTHERAPY IMPLANT;  Surgeon: Vanna Scotland, MD;  Location: [facility];  Service: Urology;  Laterality: N/A;   SKIN CANCER EXCISION  TONSILLECTOMY      Home Medications:  Allergies as of 07/18/2022       Reactions   Flomax [tamsulosin Hcl] Other (See Comments)   Dizziness        Medication List        Accurate as of July 18, 2022  3:59 PM. If you have any questions, ask your nurse or doctor.          STOP taking these medications    acetaminophen 650 MG CR tablet Commonly known as: TYLENOL       TAKE these medications    aspirin 81 MG tablet Take 81 mg by mouth daily. Reported on 02/02/2015   benzonatate 100 MG capsule Commonly known as: TESSALON Take 1 capsule (100 mg total) by mouth 2 (two) times daily as needed for cough.   CINNAMON PO Take by mouth daily.   docusate sodium 100 MG capsule Commonly known as: COLACE Take 100 mg by mouth 2 (two) times daily.   ibuprofen 800 MG tablet Commonly known as: ADVIL Take 1 tablet (800 mg total)  by mouth every 6 (six) hours as needed.   lisinopril 20 MG tablet Commonly known as: ZESTRIL TAKE 1 TABLET BY MOUTH DAILY   multivitamin tablet Take 1 tablet by mouth daily.   nystatin cream Commonly known as: MYCOSTATIN Apply 1 Application topically 2 (two) times daily.   pantoprazole 40 MG tablet Commonly known as: PROTONIX TAKE 1 TABLET BY MOUTH DAILY   sildenafil 50 MG tablet Commonly known as: VIAGRA TK 1 T PO PRN        Allergies:  Allergies  Allergen Reactions   Flomax [Tamsulosin Hcl] Other (See Comments)    Dizziness    Family History: Family History  Problem Relation Age of Onset   Heart disease Father    Kidney disease Father        one kidney removed   Stroke Mother    Prostate cancer Neg Hx     Social History:  reports that he quit smoking about 50 years ago. His smoking use included cigarettes. He has never used smokeless tobacco. He reports current alcohol use of about 21.0 standard drinks of alcohol per week. He reports that he does not use drugs.   Physical Exam: BP 123/65   Pulse 92   Ht 5\' 10"  (1.778 m)   Wt 192 lb (87.1 kg)   BMI 27.55 kg/m   Constitutional:  Well nourished. Alert and oriented, No acute distress. HEENT: [place] AT, moist mucus membranes.  Trachea midline Cardiovascular: No clubbing, cyanosis, or edema. Respiratory: Normal respiratory effort, no increased work of breathing. Neurologic: Grossly intact, no focal deficits, moving all 4 extremities. Psychiatric: Normal mood and affect.   Laboratory Data: Results for orders placed or performed in visit on 07/07/22  PSA  Result Value Ref Range   Prostate Specific Ag, Serum 0.1 0.0 - 4.0 ng/mL  I have reviewed the labs.     Assessment & Plan:    1. Prostate cancer [facility]) - S/p brachytherapy on 10/2015 for prostate cancer progression with possible high grade lesion on MRI  - PSA with appropriate response - He has some urinary frequency/ urgency with minimal bother   2.  BPH with LUTS -PSA stable  -most bothersome symptoms are nocturia -continue conservative management, avoiding bladder irritants and timed voiding's -He does not have desire any treatment at this time as the nocturia is not the issue keeping him awake   3. Erectile dysfunction -  not sexually active at this time  Return in about 1 year (around 07/18/2023) for PSA, I PSS .  Cloretta Ned   [facility] [address] [place], [place] [address] [phone]

## 2022-07-25 DIAGNOSIS — M170 Bilateral primary osteoarthritis of knee: Secondary | ICD-10-CM | POA: Diagnosis not present

## 2022-07-25 DIAGNOSIS — M1711 Unilateral primary osteoarthritis, right knee: Secondary | ICD-10-CM | POA: Diagnosis not present

## 2022-07-25 DIAGNOSIS — M1712 Unilateral primary osteoarthritis, left knee: Secondary | ICD-10-CM | POA: Diagnosis not present

## 2022-07-30 NOTE — Progress Notes (Signed)
 Chief Complaint: Chief Complaint  Patient presents with  . Bilateral knee degenerative arthrosis    Reason for Visit: The patient is a 82 y.o. male who presents today for reevaluation of both knees. He has several year history of bilateral knee pain with the left knee more symptomatic.  He localizes most of the pain along the medial aspect of the knees. He reports no swelling, no locking, and some giving way of the knees. The pain is aggravated by any weight bearing. The knee pain limits the patient's ability to ambulate long distances. The patient has not appreciated any significant improvement despite Tylenol , NSAIDs, home exercise, activity modification, and viscosupplementation. He is not using any ambulatory aids. The patient states that the knee pain has progressed to the point that it is significantly interfering with his activities of daily living.  Of note, he does report some left hip pain.He localizes the pain along the trochanteric region. He had similar pain to the right hip in the past and received a corticosteroid injection to the right trochanteric bursa.  Medications: Current Outpatient Medications  Medication Sig Dispense Refill  . acetaminophen  (TYLENOL ) 650 MG ER tablet One tab  as needed every 8 hours for pain, 90 tabs 90 tablet 11  . lisinopriL (ZESTRIL) 20 MG tablet Take 20 mg by mouth once daily    . LYCOPENE ORAL Take by mouth once daily    . meloxicam (MOBIC) 7.5 MG tablet TAKE 1 TABLET BY MOUTH DAILY AS NEEDED WITH FOOD FOR JOINT PAIN 90 tablet 0  . MILK THISTLE ORAL Take by mouth once daily    . multivitamin capsule Take 1 capsule by mouth once daily    . nystatin  (MYCOSTATIN ) 100,000 unit/gram cream Apply 1 Application topically once daily as needed       . pantoprazole (PROTONIX) 40 MG DR tablet Take 40 mg by mouth once daily    . RED YEAST RICE ORAL Take by mouth once daily     No current facility-administered medications for this visit.     Allergies: Allergies  Allergen Reactions  . Tamsulosin Hcl Dizziness    Past Medical History: Past Medical History:  Diagnosis Date  . Prostate cancer (CMS/HHS-HCC)     Past Surgical History: History reviewed. No pertinent surgical history.  Social History: Social History   Socioeconomic History  . Marital status: Divorced  . Number of children: 2  . Years of education: 68  . Highest education level: High school graduate  Occupational History  . Occupation: RetiredArtist  Tobacco Use  . Smoking status: Former    Types: Cigarettes  . Smokeless tobacco: Never  Vaping Use  . Vaping status: Never Used  Substance and Sexual Activity  . Alcohol use: Yes    Comment: 20 drinks a week- beer or wine  . Drug use: Never  . Sexual activity: Defer    Partners: Female    Family History: Family History  Problem Relation Name Age of Onset  . Rheum arthritis Sister      Review of Systems: A comprehensive 14 point ROS was performed, reviewed, and the pertinent orthopaedic findings are documented in the HPI.  Exam BP 124/62   Ht 177.8 cm (5' 10)   Wt 84.9 kg (187 lb 3.2 oz)   BMI 26.86 kg/m   General:  Well-developed, well-nourished male seen in no acute distress.  Antalgic, slightly flexed-knee gait. Varus thrust to the both knees.  HEENT:  Atraumatic, normocephalic.  Pupils are equal and reactive to light.  Extraocular motion is intact. Sclera are clear.  Oropharynx is clear with moist mucosa.  Neck:  Supple, nontender, and with good ROM. No thyromegaly, adenopathy, JVD, or carotid bruits.  Lungs:  Clear to auscultation bilaterally.  Cardiovascular:  Regular rate and rhythm.  Normal S1, S2.  No murmur .  No appreciable gallops or rubs. Peripheral pulses are palpable.  No lower extremity edema.  Homan`s test is negative.  Abdomen:  Soft, nontender, nondistended.  Bowel sounds are present.  Extremities: Good strength, stability,  and range of motion of the upper extremities. Good range of motion of the hips and ankles.  Right  Knee:    Soft tissue swelling: none    Effusion:  none    Erythema:  none    Crepitance:  mild    Tenderness:  medial    Alignment:  relative varus    Mediolateral laxity: medial pseudolaxity    Posterior sag: negative    Patellar tracking: Good tracking without evidence of subluxation or tilt    Atrophy:  No significant atrophy.      Quadriceps tone was good.    Range of motion: 0/5/132 degrees Hamstrings were tight.  Left  Knee:    Soft tissue swelling: none    Effusion:  none    Erythema:  none    Crepitance:  mild    Tenderness:  medial    Alignment:  relative varus    Mediolateral laxity: medial pseudolaxity    Posterior sag: negative    Patellar tracking: Good tracking without evidence of subluxation or tilt    Atrophy:  No significant atrophy.      Quadriceps tone was good.    Range of motion: 0/6/130 degrees Hamstrings were tight.  Left Hip:  Pelvic tilt:  Negative  Limb lengths:  Equal with the patient standing  Soft tissue swelling: Negative  Erythema:  Negative  Crepitance:  Negative  Tenderness:  Greater trochanter is minimally tender to palpation. Mimimal pain is elicited by axial compression or extremes of rotation.  Atrophy:  No atrophy. Good hip flexor and abductor strength.  Range of Motion: EXT/FLEX: 0/0/110   ADD/ABD: 30/0/30   IR/ER: 20/0/20  Neurologic:  Awake, alert, and oriented.  Sensory function is intact to pinprick and light touch.   Motor strength is judged to be 5/5.   Motor coordination is within normal limits.   No apparent clonus. No tremor.    X-rays: I ordered and interpreted standing AP, lateral, and sunrise radiographs of the right knee that were obtained in the office today. There is significant narrowing of the medial cartilage space with bone-on-bone articulation and associated varus alignment.  Osteophyte formation is noted.  Subchondral sclerosis is noted.  No evidence of fracture or dislocation.   I ordered and interpreted standing AP, lateral, and sunrise radiographs of the left knee that were obtained in the office today. There is significant narrowing of the medial cartilage space with bone-on-bone articulation and associated varus alignment.  Osteophyte formation is noted. Subchondral sclerosis is noted.  No evidence of fracture or dislocation.   I reviewed the hip radiographs that were performed at [facility] on 09/14/2021. Reasonably good preservation of the superior cartilage is noted to both hips. There is some central joint space narrowing with inferior osteophyte formation. No significant subchondral sclerosis.  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 The patient was given informational material on total knee replacement. Conservative treatment options were reviewed with the patient.  We discussed the risks and benefits of surgical intervention.  The usual perioperative course was also discussed in detail.  The patient expressed understanding of the risks and benefits of surgical intervention and would like to proceed with plans for left total knee arthroplasty in late September.  I spent a total of 45 minutes in both face-to-face and non-face-to-face activities, excluding procedures performed, for this visit on the date of this encounter.  MEDICAL CLEARANCE: Per anesthesiology. ACTIVITY: As tolerated. WORK STATUS: Not applicable. THERAPY: Preoperative physical therapy evaluation. MEDICATIONS: Requested Prescriptions    No prescriptions requested or ordered in this encounter   FOLLOW-UP: Return for preop History & Physical pending surgery date.    James P. Hooten, Jr., M.D.  This note was generated in part with voice recognition  software and I apologize for any typographical errors that were not detected and corrected.

## 2022-08-30 DIAGNOSIS — I10 Essential (primary) hypertension: Secondary | ICD-10-CM | POA: Diagnosis not present

## 2022-08-30 DIAGNOSIS — Z86010 Personal history of colonic polyps: Secondary | ICD-10-CM | POA: Diagnosis not present

## 2022-08-30 DIAGNOSIS — K219 Gastro-esophageal reflux disease without esophagitis: Secondary | ICD-10-CM | POA: Diagnosis not present

## 2022-10-08 ENCOUNTER — Encounter (HOSPITAL_COMMUNITY): Payer: Self-pay | Admitting: Urgent Care

## 2022-10-09 NOTE — Discharge Instructions (Signed)

## 2022-10-10 ENCOUNTER — Other Ambulatory Visit: Payer: Self-pay

## 2022-10-10 ENCOUNTER — Other Ambulatory Visit: Payer: Medicare Other

## 2022-10-10 ENCOUNTER — Encounter
Admission: RE | Admit: 2022-10-10 | Discharge: 2022-10-10 | Disposition: A | Discharge status: Home / Self Care | Payer: Medicare Other | Source: Ambulatory Visit | Attending: Orthopedic Surgery | Admitting: Orthopedic Surgery

## 2022-10-10 VITALS — BP 128/69 | HR 61 | Resp 16 | Ht 70.0 in | Wt 190.0 lb

## 2022-10-10 DIAGNOSIS — R001 Bradycardia, unspecified: Secondary | ICD-10-CM | POA: Diagnosis not present

## 2022-10-10 DIAGNOSIS — M1712 Unilateral primary osteoarthritis, left knee: Secondary | ICD-10-CM | POA: Diagnosis not present

## 2022-10-10 DIAGNOSIS — Z01818 Encounter for other preprocedural examination: Secondary | ICD-10-CM | POA: Diagnosis not present

## 2022-10-10 DIAGNOSIS — I493 Ventricular premature depolarization: Secondary | ICD-10-CM | POA: Insufficient documentation

## 2022-10-10 DIAGNOSIS — Z01812 Encounter for preprocedural laboratory examination: Secondary | ICD-10-CM

## 2022-10-10 HISTORY — DX: Anxiety disorder, unspecified: F41.9

## 2022-10-10 HISTORY — DX: Essential (primary) hypertension: I10

## 2022-10-10 LAB — CBC
HCT: 42.4 % (ref 39.0–52.0)
Hemoglobin: 15.3 g/dL (ref 13.0–17.0)
MCH: 32.9 pg (ref 26.0–34.0)
MCHC: 36.1 g/dL — ABNORMAL HIGH (ref 30.0–36.0)
MCV: 91.2 fL (ref 80.0–100.0)
Platelets: 272 10*3/uL (ref 150–400)
RBC: 4.65 MIL/uL (ref 4.22–5.81)
RDW: 12.5 % (ref 11.5–15.5)
WBC: 7.1 10*3/uL (ref 4.0–10.5)
nRBC: 0 % (ref 0.0–0.2)

## 2022-10-10 LAB — URINALYSIS, ROUTINE W REFLEX MICROSCOPIC
Bilirubin Urine: NEGATIVE
Glucose, UA: NEGATIVE mg/dL
Hgb urine dipstick: NEGATIVE
Ketones, ur: NEGATIVE mg/dL
Leukocytes,Ua: NEGATIVE
Nitrite: NEGATIVE
Protein, ur: NEGATIVE mg/dL
Specific Gravity, Urine: 1.012 (ref 1.005–1.030)
pH: 6 (ref 5.0–8.0)

## 2022-10-10 LAB — COMPREHENSIVE METABOLIC PANEL
ALT: 19 U/L (ref 0–44)
AST: 21 U/L (ref 15–41)
Albumin: 4 g/dL (ref 3.5–5.0)
Alkaline Phosphatase: 53 U/L (ref 38–126)
Anion gap: 10 (ref 5–15)
BUN: 15 mg/dL (ref 8–23)
CO2: 21 mmol/L — ABNORMAL LOW (ref 22–32)
Calcium: 9 mg/dL (ref 8.9–10.3)
Chloride: 106 mmol/L (ref 98–111)
Creatinine, Ser: 1.05 mg/dL (ref 0.61–1.24)
GFR, Estimated: >60 mL/min (ref >60)
Glucose, Bld: 95 mg/dL (ref 70–99)
Potassium: 4.2 mmol/L (ref 3.5–5.1)
Sodium: 137 mmol/L (ref 135–145)
Total Bilirubin: 1.1 mg/dL (ref 0.3–1.2)
Total Protein: 6.9 g/dL (ref 6.5–8.1)

## 2022-10-10 LAB — SURGICAL PCR SCREEN
MRSA, PCR: NEGATIVE
Staphylococcus aureus: NEGATIVE

## 2022-10-10 LAB — SEDIMENTATION RATE: Sed Rate: 3 mm/hr (ref 0–20)

## 2022-10-10 LAB — TYPE AND SCREEN
ABO/RH(D): O NEG
Antibody Screen: NEGATIVE

## 2022-10-10 LAB — C-REACTIVE PROTEIN: CRP: <0.5 mg/dL (ref <1.0)

## 2022-10-10 NOTE — Patient Instructions (Addendum)
Your procedure is scheduled on: 10/18/22 - Wednesday Report to the Registration Desk on the 1st floor of the [facility]. To find out your arrival time, please call [phone] between 1PM - 3PM on: 10/17/22 - Tuesday If your arrival time is 6:00 am, do not arrive before that time as the [facility] entrance doors do not open until 6:00 am.  REMEMBER: Instructions that are not followed completely may result in serious medical risk, up to and including death; or upon the discretion of your surgeon and anesthesiologist your surgery may need to be rescheduled.  Do not eat food after midnight the night before surgery.  No gum chewing or hard candies.  You may however, drink CLEAR liquids up to 2 hours before you are scheduled to arrive for your surgery. Do not drink anything within 2 hours of your scheduled arrival time.  Clear liquids include: - water  - apple juice without pulp - gatorade (not RED colors) - black coffee or tea (Do NOT add milk or creamers to the coffee or tea) Do NOT drink anything that is not on this list.  In addition, your doctor has ordered for you to drink the provided:  Ensure Pre-Surgery Clear Carbohydrate Drink  Drinking this carbohydrate drink up to two hours before surgery helps to reduce insulin resistance and improve patient outcomes. Please complete drinking 2 hours before scheduled arrival time.  One week prior to surgery: Stop on 10/11/22, meloxicam (MOBIC) , Anti-inflammatories (NSAIDS) such as Advil, Aleve, Ibuprofen, Motrin, Naproxen, Naprosyn and Aspirin based products such as Excedrin, Goody's Powder, BC Powder. You may take Tylenol if needed for pain up until the day of surgery.  Stop on 10/11/22, ANY OVER THE COUNTER supplements until after surgery : CINNAMON , Ascorbic Acid , VITAMIN D-3 , LYCOPENE , milk thistle , ZINC OXIDE , Multiple Vitamin (MULTIVITAMIN    Continue taking all prescribed medications with the exception of the  following:  HOLD sildenafil (VIAGRA) 2 days prior tp your surgery.   TAKE ONLY THESE MEDICATIONS THE MORNING OF SURGERY WITH A SIP OF WATER:  pantoprazole (PROTONIX) - (take one the night before and one on the morning of surgery - helps to prevent nausea after surgery.)   No Alcohol for 24 hours before or after surgery.  No Smoking including e-cigarettes for 24 hours before surgery.  No chewable tobacco products for at least 6 hours before surgery.  No nicotine patches on the day of surgery.  Do not use any "recreational" drugs for at least a week (preferably 2 weeks) before your surgery.  Please be advised that the combination of cocaine and anesthesia may have negative outcomes, up to and including death. If you test positive for cocaine, your surgery will be cancelled.  On the morning of surgery brush your teeth with toothpaste and water, you may rinse your mouth with mouthwash if you wish. Do not swallow any toothpaste or mouthwash.  Use CHG Soap or wipes as directed on instruction sheet.  Do not wear jewelry, make-up, hairpins, clips or nail polish.  For welded (permanent) jewelry: bracelets, anklets, waist bands, etc.  Please have this removed prior to surgery.  If it is not removed, there is a chance that hospital personnel will need to cut it off on the day of surgery.  Do not wear lotions, powders, or perfumes.   Do not shave body hair from the neck down 48 hours before surgery.  Contact lenses, hearing aids and dentures may not be  worn into surgery.  Do not bring valuables to the hospital. [facility] is not responsible for any missing/lost belongings or valuables.   Notify your doctor if there is any change in your medical condition (cold, fever, infection).  Wear comfortable clothing (specific to your surgery type) to the hospital.  After surgery, you can help prevent lung complications by doing breathing exercises.  Take deep breaths and cough every 1-2 hours.  Your doctor may order a device called an Incentive Spirometer to help you take deep breaths. When coughing or sneezing, hold a pillow firmly against your incision with both hands. This is called "splinting." Doing this helps protect your incision. It also decreases belly discomfort.  If you are being admitted to the hospital overnight, leave your suitcase in the car. After surgery it may be brought to your room.  In case of increased patient census, it may be necessary for you, the patient, to continue your postoperative care in the Same Day Surgery department.  If you are being discharged the day of surgery, you will not be allowed to drive home. You will need a responsible individual to drive you home and stay with you for 24 hours after surgery.   If you are taking public transportation, you will need to have a responsible individual with you.  Please call the Pre-admissions Testing Dept. at [phone] if you have any questions about these instructions.  Surgery Visitation Policy:  Patients having surgery or a procedure may have two visitors.  Children under the age of 35 must have an adult with them who is not the patient.  Inpatient Visitation:    Visiting hours are 7 a.m. to 8 p.m. Up to four visitors are allowed at one time in a patient room. The visitors may rotate out with other people during the day.  One visitor age 65 or older may stay with the patient overnight and must be in the room by 8 p.m.    Pre-operative 5 CHG Bath Instructions   You can play a key role in reducing the risk of infection after surgery. Your skin needs to be as free of germs as possible. You can reduce the number of germs on your skin by washing with CHG (chlorhexidine gluconate) soap before surgery. CHG is an antiseptic soap that kills germs and continues to kill germs even after washing.   DO NOT use if you have an allergy to chlorhexidine/CHG or antibacterial soaps. If your skin becomes  reddened or irritated, stop using the CHG and notify one of our RNs at [phone].   Please shower with the CHG soap starting 4 days before surgery using the following schedule: 09/28 - 10/02.    Please keep in mind the following:  DO NOT shave, including legs and underarms, starting the day of your first shower.   You may shave your face at any point before/day of surgery.  Place clean sheets on your bed the day you start using CHG soap. Use a clean washcloth (not used since being washed) for each shower. DO NOT sleep with pets once you start using the CHG.   CHG Shower Instructions:  If you choose to wash your hair and private area, wash first with your normal shampoo/soap.  After you use shampoo/soap, rinse your hair and body thoroughly to remove shampoo/soap residue.  Turn the water OFF and apply about 3 tablespoons (45 ml) of CHG soap to a CLEAN washcloth.  Apply CHG soap ONLY FROM YOUR NECK DOWN  TO YOUR TOES (washing for 3-5 minutes)  DO NOT use CHG soap on face, private areas, open wounds, or sores.  Pay special attention to the area where your surgery is being performed.  If you are having back surgery, having someone wash your back for you may be helpful. Wait 2 minutes after CHG soap is applied, then you may rinse off the CHG soap.  Pat dry with a clean towel  Put on clean clothes/pajamas   If you choose to wear lotion, please use ONLY the CHG-compatible lotions on the back of this paper.     Additional instructions for the day of surgery: DO NOT APPLY any lotions, deodorants, cologne, or perfumes.   Put on clean/comfortable clothes.  Brush your teeth.  Ask your nurse before applying any prescription medications to the skin.      CHG Compatible Lotions   Aveeno Moisturizing lotion  Cetaphil Moisturizing Cream  Cetaphil Moisturizing Lotion  Clairol Herbal Essence Moisturizing Lotion, Dry Skin  Clairol Herbal Essence Moisturizing Lotion, Extra Dry Skin  Clairol  Herbal Essence Moisturizing Lotion, Normal Skin  Curel Age Defying Therapeutic Moisturizing Lotion with Alpha Hydroxy  Curel Extreme Care Body Lotion  Curel Soothing Hands Moisturizing Hand Lotion  Curel Therapeutic Moisturizing Cream, Fragrance-Free  Curel Therapeutic Moisturizing Lotion, Fragrance-Free  Curel Therapeutic Moisturizing Lotion, Original Formula  Eucerin Daily Replenishing Lotion  Eucerin Dry Skin Therapy Plus Alpha Hydroxy Crme  Eucerin Dry Skin Therapy Plus Alpha Hydroxy Lotion  Eucerin Original Crme  Eucerin Original Lotion  Eucerin Plus Crme Eucerin Plus Lotion  Eucerin TriLipid Replenishing Lotion  Keri Anti-Bacterial Hand Lotion  Keri Deep Conditioning Original Lotion Dry Skin Formula Softly Scented  Keri Deep Conditioning Original Lotion, Fragrance Free Sensitive Skin Formula  Keri Lotion Fast Absorbing Fragrance Free Sensitive Skin Formula  Keri Lotion Fast Absorbing Softly Scented Dry Skin Formula  Keri Original Lotion  Keri Skin Renewal Lotion Keri Silky Smooth Lotion  Keri Silky Smooth Sensitive Skin Lotion  Nivea Body Creamy Conditioning Oil  Nivea Body Extra Enriched Lotion  Nivea Body Original Lotion  Nivea Body Sheer Moisturizing Lotion Nivea Crme  Nivea Skin Firming Lotion  NutraDerm 30 Skin Lotion  NutraDerm Skin Lotion  NutraDerm Therapeutic Skin Cream  NutraDerm Therapeutic Skin Lotion  ProShield Protective Hand Cream  Provon moisturizing lotion  How to Use an Incentive Spirometer  An incentive spirometer is a tool that measures how well you are filling your lungs with each breath. Learning to take long, deep breaths using this tool can help you keep your lungs clear and active. This may help to reverse or lessen your chance of developing breathing (pulmonary) problems, especially infection. You may be asked to use a spirometer: After a surgery. If you have a lung problem or a history of smoking. After a long period of time when you have  been unable to move or be active. If the spirometer includes an indicator to show the highest number that you have reached, your health care provider or respiratory therapist will help you set a goal. Keep a log of your progress as told by your health care provider. What are the risks? Breathing too quickly may cause dizziness or cause you to pass out. Take your time so you do not get dizzy or light-headed. If you are in pain, you may need to take pain medicine before doing incentive spirometry. It is harder to take a deep breath if you are having pain. How to use your incentive spirometer  Sit up on the edge of your bed or on a chair. Hold the incentive spirometer so that it is in an upright position. Before you use the spirometer, breathe out normally. Place the mouthpiece in your mouth. Make sure your lips are closed tightly around it. Breathe in slowly and as deeply as you can through your mouth, causing the piston or the ball to rise toward the top of the chamber. Hold your breath for 3-5 seconds, or for as long as possible. If the spirometer includes a coach indicator, use this to guide you in breathing. Slow down your breathing if the indicator goes above the marked areas. Remove the mouthpiece from your mouth and breathe out normally. The piston or ball will return to the bottom of the chamber. Rest for a few seconds, then repeat the steps 10 or more times. Take your time and take a few normal breaths between deep breaths so that you do not get dizzy or light-headed. Do this every 1-2 hours when you are awake. If the spirometer includes a goal marker to show the highest number you have reached (best effort), use this as a goal to work toward during each repetition. After each set of 10 deep breaths, cough a few times. This will help to make sure that your lungs are clear. If you have an incision on your chest or abdomen from surgery, place a pillow or a rolled-up towel firmly against the  incision when you cough. This can help to reduce pain while taking deep breaths and coughing. General tips When you are able to get out of bed: Walk around often. Continue to take deep breaths and cough in order to clear your lungs. Keep using the incentive spirometer until your health care provider says it is okay to stop using it. If you have been in the hospital, you may be told to keep using the spirometer at home. Contact a health care provider if: You are having difficulty using the spirometer. You have trouble using the spirometer as often as instructed. Your pain medicine is not giving enough relief for you to use the spirometer as told. You have a fever. Get help right away if: You develop shortness of breath. You develop a cough with bloody mucus from the lungs. You have fluid or blood coming from an incision site after you cough. Summary An incentive spirometer is a tool that can help you learn to take long, deep breaths to keep your lungs clear and active. You may be asked to use a spirometer after a surgery, if you have a lung problem or a history of smoking, or if you have been inactive for a long period of time. Use your incentive spirometer as instructed every 1-2 hours while you are awake. If you have an incision on your chest or abdomen, place a pillow or a rolled-up towel firmly against your incision when you cough. This will help to reduce pain. Get help right away if you have shortness of breath, you cough up bloody mucus, or blood comes from your incision when you cough. This information is not intended to replace advice given to you by your health care provider. Make sure you discuss any questions you have with your health care provider. Document Revised: 03/24/2019 Document Reviewed: 03/24/2019 Elsevier Patient Education  2023 Elsevier Inc.   POLAR CARE INFORMATION  MassAdvertisement.it  How to use [facility] Therapy System?  YouTube    ShippingScam.co.uk  OPERATING INSTRUCTIONS  Start  the product With dry hands, connect the transformer to the electrical connection located on the top of the cooler. Next, plug the transformer into an appropriate electrical outlet. The unit will automatically start running at this point.  To stop the pump, disconnect electrical power.  Unplug to stop the product when not in use. Unplugging the Polar Care unit turns it off. Always unplug immediately after use. Never leave it plugged in while unattended. Remove pad.    FIRST ADD WATER TO FILL LINE, THEN ICE---Replace ice when existing ice is almost melted  1 Discuss Treatment with your Licensed Health Care Practitioner and Use Only as Prescribed 2 Apply Insulation Barrier & Cold Therapy Pad 3 Check for Moisture 4 Inspect Skin Regularly  Tips and Trouble Shooting Usage Tips 1. Use cubed or chunked ice for optimal performance. 2. It is recommended to drain the Pad between uses. To drain the pad, hold the Pad upright with the hose pointed toward the ground. Depress the black plunger and allow water to drain out. 3. You may disconnect the Pad from the unit without removing the pad from the affected area by depressing the silver tabs on the hose coupling and gently pulling the hoses apart. The Pad and unit will seal itself and will not leak. Note: Some dripping during release is normal. 4. DO NOT RUN PUMP WITHOUT WATER! The pump in this unit is designed to run with water. Running the unit without water will cause permanent damage to the pump. 5. Unplug unit before removing lid.  TROUBLESHOOTING GUIDE Pump not running, Water not flowing to the pad, Pad is not getting cold 1. Make sure the transformer is plugged into the wall outlet. 2. Confirm that the ice and water are filled to the indicated levels. 3. Make sure there are no kinks in the pad. 4. Gently pull on the blue tube to make sure the tube/pad junction is  straight. 5. Remove the pad from the treatment site and ll it while the pad is lying at; then reapply. 6. Confirm that the pad couplings are securely attached to the unit. Listen for the double clicks (Figure 1) to confirm the pad couplings are securely attached.  Leaks    Note: Some condensation on the lines, controller, and pads is unavoidable, especially in warmer climates. 1. If using a [employer] Polar Care Cold Therapy unit with a detachable Cold Therapy Pad, and a leak exists (other than condensation on the lines) disconnect the pad couplings. Make sure the silver tabs on the couplings are depressed before reconnecting the pad to the pump hose; then confirm both sides of the coupling are properly clicked in. 2. If the coupling continues to leak or a leak is detected in the pad itself, stop using it and call [employer] Customer Care at [phone].  Cleaning After use, empty and dry the unit with a soft cloth. Warm water and mild detergent may be used occasionally to clean the pump and tubes.  WARNING: The Polar Care Cube can be cold enough to cause serious injury, including full skin necrosis. Follow these Operating Instructions, and carefully read the Product Insert (see pouch on side of unit) and the Cold Therapy Pad Fitting Instructions (provided with each Cold Therapy Pad) prior to use.       Preoperative Educational Videos for Total Hip, Knee and Shoulder Replacements  To better prepare for surgery, please view our videos that explain the physical activity and discharge planning required to have the best surgical  recovery at [facility].  [email or web address]  Questions? Call [phone] or email [email or web address]

## 2022-10-11 DIAGNOSIS — M1712 Unilateral primary osteoarthritis, left knee: Secondary | ICD-10-CM | POA: Diagnosis not present

## 2022-10-18 ENCOUNTER — Ambulatory Visit: Admission: RE | Admit: 2022-10-18 | Payer: Medicare Other | Source: Ambulatory Visit | Admitting: Orthopedic Surgery

## 2022-10-18 ENCOUNTER — Encounter: Admission: RE | Payer: Self-pay | Source: Ambulatory Visit

## 2022-10-18 DIAGNOSIS — M1712 Unilateral primary osteoarthritis, left knee: Secondary | ICD-10-CM

## 2022-10-18 SURGERY — ARTHROPLASTY, KNEE, TOTAL, USING IMAGELESS COMPUTER-ASSISTED NAVIGATION
Anesthesia: Choice | Site: Knee | Laterality: Left

## 2022-11-21 ENCOUNTER — Other Ambulatory Visit: Payer: Self-pay | Admitting: Nurse Practitioner

## 2023-03-08 ENCOUNTER — Other Ambulatory Visit: Payer: Self-pay | Admitting: Nurse Practitioner

## 2023-07-09 DIAGNOSIS — G8929 Other chronic pain: Secondary | ICD-10-CM | POA: Diagnosis not present

## 2023-07-09 DIAGNOSIS — M545 Low back pain, unspecified: Secondary | ICD-10-CM | POA: Diagnosis not present

## 2023-07-09 DIAGNOSIS — K219 Gastro-esophageal reflux disease without esophagitis: Secondary | ICD-10-CM | POA: Diagnosis not present

## 2023-07-09 DIAGNOSIS — Z760 Encounter for issue of repeat prescription: Secondary | ICD-10-CM | POA: Diagnosis not present

## 2023-07-13 ENCOUNTER — Other Ambulatory Visit: Payer: Self-pay

## 2023-07-13 DIAGNOSIS — C61 Malignant neoplasm of prostate: Secondary | ICD-10-CM

## 2023-07-14 LAB — PSA: Prostate Specific Ag, Serum: <0.1 ng/mL (ref 0.0–4.0)

## 2023-07-18 ENCOUNTER — Ambulatory Visit: Payer: Medicare Other | Admitting: Urology

## 2023-07-24 ENCOUNTER — Ambulatory Visit: Payer: Self-pay | Admitting: Urology

## 2023-07-24 NOTE — Progress Notes (Unsigned)
 07/25/23 3:37 PM   AKSHAT MINEHART [birth year] [id number]  Referring provider:  Britta King, MD [address] [place],  [place] [address]  Urological history  Prostate cancer  -PSA (06/2023) <0.1 -Prostate cancer s/p brachytherapy on 11/01/15. -Initially dx with Gleason 3+3 prostate cancer previously on active surveillance who underwent prostate MRI which showed a concerning area at the right base for high grade carcinoma.   2. BPH with LUTS   3. ED  -failed tadalafil   HPI: LOI RENNAKER is a 83 y.o.male who presents today for one year follow up.  Previous records reviewed.     He continues to have urinary frequency, but is at baseline and not bothersome.  Patient denies any modifying or aggravating factors.  Patient denies any recent UTI's, gross hematuria, dysuria or suprapubic/flank pain.  Patient denies any fevers, chills, nausea or vomiting.    He has noticed a 10 pound weight loss over the last year.  He has not been trying.  He reports a good appetite and feeling good and denies any bone pain  PSA (06/2023) <0.1  UA (09/2022) negative  Serum creatinine (09/2022) 1.05, e GFR > 60   PMH: Past Medical History:  Diagnosis Date   Anxiety    situational   Arthritis    BPH (benign prostatic hyperplasia)    Cancer (HCC)    Elevated PSA 02/02/2015   GERD (gastroesophageal reflux disease)    Gout    Hypertension    Motion sickness    Nocturia    Prostate cancer (HCC)    Prostatitis     Surgical History: Past Surgical History:  Procedure Laterality Date   APPENDECTOMY     COLONOSCOPY WITH PROPOFOL  N/A 03/20/2016   Procedure: COLONOSCOPY WITH PROPOFOL ;  Surgeon: Rogelia Copping, MD;  [facility];  Service: Endoscopy;  Laterality: N/A;   CYSTOSCOPY  11/01/2015   Procedure: CYSTOSCOPY;  Surgeon: Rosina Riis, MD;  Location: [facility];  Service: Urology;;   POLYPECTOMY  03/20/2016   Procedure: POLYPECTOMY;  Surgeon: Rogelia Copping, MD;   Location: [facility];  Service: Endoscopy;;   RADIOACTIVE SEED IMPLANT N/A 11/01/2015   Procedure: RADIOACTIVE SEED IMPLANT/BRACHYTHERAPY IMPLANT;  Surgeon: Rosina Riis, MD;  Location: [facility] OR[facility];  Service: Urology;  Laterality: N/A;   SKIN CANCER EXCISION     TONSILLECTOMY      Home Medications:  Allergies as of 07/25/2023       Reactions   Flomax [tamsulosin Hcl] Other (See Comments)   Dizziness        Medication List        Accurate as of July 25, 2023  3:37 PM. If you have any questions, ask your nurse or doctor.          aspirin 81 MG tablet Take 81 mg by mouth daily. Reported on 02/02/2015   CINNAMON PO Take by mouth daily.   docusate sodium  100 MG capsule Commonly known as: COLACE Take 100 mg by mouth 2 (two) times daily.   ibuprofen  800 MG tablet Commonly known as: ADVIL  Take 1 tablet (800 mg total) by mouth every 6 (six) hours as needed.   lisinopril 20 MG tablet Commonly known as: ZESTRIL TAKE 1 TABLET BY MOUTH DAILY   LYCOPENE PO Take 15 mg by mouth daily.   meloxicam 7.5 MG tablet Commonly known as: MOBIC Take 7.5 mg by mouth as needed for pain.   milk thistle 175 MG tablet Take 175 mg by mouth in  the morning and at bedtime.   multivitamin tablet Take 1 tablet by mouth daily.   nystatin  cream Commonly known as: MYCOSTATIN  Apply 1 Application topically 2 (two) times daily. What changed:  when to take this reasons to take this   pantoprazole 40 MG tablet Commonly known as: PROTONIX TAKE 1 TABLET BY MOUTH DAILY   sildenafil 50 MG tablet Commonly known as: VIAGRA TK 1 T PO PRN   vitamin C 1000 MG tablet Take 1,000 mg by mouth daily.   Vitamin D-3 125 MCG (5000 UT) Tabs Take by mouth daily.   ZINC  OXIDE PO Take by mouth as needed.        Allergies:  Allergies  Allergen Reactions   Flomax [Tamsulosin Hcl] Other (See Comments)    Dizziness    Family History: Family History  Problem Relation Age of Onset   Heart  disease Father    Kidney disease Father        one kidney removed   Stroke Mother    Prostate cancer Neg Hx     Social History:  reports that he quit smoking about 51 years ago. His smoking use included cigarettes. He has never used smokeless tobacco. He reports current alcohol use of about 21.0 standard drinks of alcohol per week. He reports that he does not use drugs.   Physical Exam: BP 130/72   Pulse 80   Ht 5' 10 (1.778 m)   Wt 183 lb 1.6 oz (83.1 kg)   BMI 26.27 kg/m   Constitutional:  Well nourished. Alert and oriented, No acute distress. HEENT: [place] AT, moist mucus membranes.  Trachea midline Cardiovascular: No clubbing, cyanosis, or edema. Respiratory: Normal respiratory effort, no increased work of breathing. Neurologic: Grossly intact, no focal deficits, moving all 4 extremities. Psychiatric: Normal mood and affect.   Laboratory Data: See HPI and EPIC I have reviewed the labs.     Assessment & Plan:    1. Prostate cancer (HCC) - PSA is undetectable - Explained that the weight loss is likely not due to any prostate cancer, but to follow-up with his PCP for further evaluation  2. BPH with LUTS - Stable symptoms   3. Erectile dysfunction -not sexually active at this time  Return in about 1 year (around 07/24/2024) for PSA and symptom recheck .  CLOTILDA HELON RIGGERS   [facility] [address] [place], [place] [address] [phone]

## 2023-07-25 ENCOUNTER — Encounter: Payer: Self-pay | Admitting: Urology

## 2023-07-25 ENCOUNTER — Ambulatory Visit: Admitting: Urology

## 2023-07-25 VITALS — BP 130/72 | HR 80 | Ht 70.0 in | Wt 183.1 lb

## 2023-07-25 DIAGNOSIS — R351 Nocturia: Secondary | ICD-10-CM

## 2023-07-25 DIAGNOSIS — N529 Male erectile dysfunction, unspecified: Secondary | ICD-10-CM | POA: Diagnosis not present

## 2023-07-25 DIAGNOSIS — C61 Malignant neoplasm of prostate: Secondary | ICD-10-CM

## 2023-08-10 DIAGNOSIS — H9202 Otalgia, left ear: Secondary | ICD-10-CM | POA: Diagnosis not present

## 2023-08-10 DIAGNOSIS — H6122 Impacted cerumen, left ear: Secondary | ICD-10-CM | POA: Diagnosis not present

## 2023-08-10 DIAGNOSIS — H900 Conductive hearing loss, bilateral: Secondary | ICD-10-CM | POA: Diagnosis not present

## 2023-08-14 DIAGNOSIS — H6983 Other specified disorders of Eustachian tube, bilateral: Secondary | ICD-10-CM | POA: Diagnosis not present

## 2023-08-14 DIAGNOSIS — H6122 Impacted cerumen, left ear: Secondary | ICD-10-CM | POA: Diagnosis not present

## 2023-09-04 DIAGNOSIS — M1A9XX0 Chronic gout, unspecified, without tophus (tophi): Secondary | ICD-10-CM | POA: Diagnosis not present

## 2023-09-04 DIAGNOSIS — M109 Gout, unspecified: Secondary | ICD-10-CM | POA: Diagnosis not present

## 2023-09-04 DIAGNOSIS — Z131 Encounter for screening for diabetes mellitus: Secondary | ICD-10-CM | POA: Diagnosis not present

## 2023-09-04 DIAGNOSIS — Z0000 Encounter for general adult medical examination without abnormal findings: Secondary | ICD-10-CM | POA: Diagnosis not present

## 2023-09-04 DIAGNOSIS — L989 Disorder of the skin and subcutaneous tissue, unspecified: Secondary | ICD-10-CM | POA: Diagnosis not present

## 2023-09-04 DIAGNOSIS — R634 Abnormal weight loss: Secondary | ICD-10-CM | POA: Diagnosis not present

## 2023-09-04 DIAGNOSIS — M150 Primary generalized (osteo)arthritis: Secondary | ICD-10-CM | POA: Diagnosis not present

## 2023-09-04 DIAGNOSIS — E785 Hyperlipidemia, unspecified: Secondary | ICD-10-CM | POA: Diagnosis not present

## 2023-09-04 DIAGNOSIS — I10 Essential (primary) hypertension: Secondary | ICD-10-CM | POA: Diagnosis not present

## 2023-09-06 ENCOUNTER — Ambulatory Visit: Admitting: Nurse Practitioner

## 2023-09-11 DIAGNOSIS — H2513 Age-related nuclear cataract, bilateral: Secondary | ICD-10-CM | POA: Diagnosis not present

## 2023-09-11 DIAGNOSIS — H04123 Dry eye syndrome of bilateral lacrimal glands: Secondary | ICD-10-CM | POA: Diagnosis not present

## 2023-09-11 DIAGNOSIS — D23112 Other benign neoplasm of skin of right lower eyelid, including canthus: Secondary | ICD-10-CM | POA: Diagnosis not present

## 2023-09-11 DIAGNOSIS — H43813 Vitreous degeneration, bilateral: Secondary | ICD-10-CM | POA: Diagnosis not present

## 2023-09-26 DIAGNOSIS — D492 Neoplasm of unspecified behavior of bone, soft tissue, and skin: Secondary | ICD-10-CM | POA: Diagnosis not present

## 2023-10-02 DIAGNOSIS — K219 Gastro-esophageal reflux disease without esophagitis: Secondary | ICD-10-CM | POA: Diagnosis not present

## 2023-10-02 DIAGNOSIS — M545 Low back pain, unspecified: Secondary | ICD-10-CM | POA: Diagnosis not present

## 2023-10-02 DIAGNOSIS — M1A9XX0 Chronic gout, unspecified, without tophus (tophi): Secondary | ICD-10-CM | POA: Diagnosis not present

## 2023-10-02 DIAGNOSIS — I10 Essential (primary) hypertension: Secondary | ICD-10-CM | POA: Diagnosis not present

## 2023-10-02 DIAGNOSIS — G8929 Other chronic pain: Secondary | ICD-10-CM | POA: Diagnosis not present

## 2023-10-10 DIAGNOSIS — H6123 Impacted cerumen, bilateral: Secondary | ICD-10-CM | POA: Diagnosis not present

## 2023-10-10 DIAGNOSIS — H6983 Other specified disorders of Eustachian tube, bilateral: Secondary | ICD-10-CM | POA: Diagnosis not present

## 2024-01-30 ENCOUNTER — Encounter: Payer: Self-pay | Admitting: Urology

## 2024-07-17 ENCOUNTER — Other Ambulatory Visit

## 2024-07-24 ENCOUNTER — Ambulatory Visit: Admitting: Urology

## 2024-07-28 ENCOUNTER — Ambulatory Visit: Admitting: Urology
